# Patient Record
Sex: Female | Born: 1945 | Race: Black or African American | Hispanic: No | Marital: Married | Smoking: Never smoker
Health system: Southern US, Community
[De-identification: ages and names within clinical notes are randomized; demographics above are authoritative.]

## PROBLEM LIST (undated history)

## (undated) DIAGNOSIS — I10 Essential (primary) hypertension: Secondary | ICD-10-CM

## (undated) DIAGNOSIS — D649 Anemia, unspecified: Secondary | ICD-10-CM

## (undated) DIAGNOSIS — E785 Hyperlipidemia, unspecified: Secondary | ICD-10-CM

## (undated) DIAGNOSIS — H9190 Unspecified hearing loss, unspecified ear: Secondary | ICD-10-CM

## (undated) DIAGNOSIS — M1990 Unspecified osteoarthritis, unspecified site: Secondary | ICD-10-CM

## (undated) DIAGNOSIS — H269 Unspecified cataract: Secondary | ICD-10-CM

## (undated) DIAGNOSIS — G4733 Obstructive sleep apnea (adult) (pediatric): Secondary | ICD-10-CM

## (undated) HISTORY — DX: Hyperlipidemia, unspecified: E78.5

## (undated) HISTORY — PX: ABDOMINAL HYSTERECTOMY: SHX81

## (undated) HISTORY — DX: Essential (primary) hypertension: I10

## (undated) HISTORY — DX: Unspecified cataract: H26.9

## (undated) HISTORY — DX: Unspecified osteoarthritis, unspecified site: M19.90

## (undated) HISTORY — PX: COLONOSCOPY: SHX174

## (undated) HISTORY — DX: Anemia, unspecified: D64.9

## (undated) HISTORY — DX: Unspecified hearing loss, unspecified ear: H91.90

## (undated) HISTORY — PX: POLYPECTOMY: SHX149

---
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
## 1984-07-01 HISTORY — PX: MANDIBLE SURGERY: SHX707

## 1997-11-18 ENCOUNTER — Other Ambulatory Visit: Admission: RE | Admit: 1997-11-18 | Discharge: 1997-11-18 | Payer: Self-pay | Admitting: Family Medicine

## 1998-06-22 ENCOUNTER — Encounter: Payer: Self-pay | Admitting: Family Medicine

## 1998-06-22 ENCOUNTER — Ambulatory Visit (HOSPITAL_COMMUNITY): Admission: RE | Admit: 1998-06-22 | Discharge: 1998-06-22 | Payer: Self-pay | Admitting: Family Medicine

## 1999-06-27 ENCOUNTER — Ambulatory Visit (HOSPITAL_COMMUNITY): Admission: RE | Admit: 1999-06-27 | Discharge: 1999-06-27 | Payer: Self-pay | Admitting: Family Medicine

## 1999-06-27 ENCOUNTER — Encounter: Payer: Self-pay | Admitting: Family Medicine

## 1999-12-19 ENCOUNTER — Encounter: Payer: Self-pay | Admitting: Family Medicine

## 1999-12-19 ENCOUNTER — Ambulatory Visit (HOSPITAL_COMMUNITY): Admission: RE | Admit: 1999-12-19 | Discharge: 1999-12-19 | Payer: Self-pay | Admitting: Family Medicine

## 2000-07-04 ENCOUNTER — Encounter: Payer: Self-pay | Admitting: Family Medicine

## 2000-07-04 ENCOUNTER — Ambulatory Visit (HOSPITAL_COMMUNITY): Admission: RE | Admit: 2000-07-04 | Discharge: 2000-07-04 | Payer: Self-pay | Admitting: Family Medicine

## 2000-12-02 ENCOUNTER — Encounter: Payer: Self-pay | Admitting: Family Medicine

## 2000-12-02 ENCOUNTER — Encounter: Admission: RE | Admit: 2000-12-02 | Discharge: 2000-12-02 | Payer: Self-pay | Admitting: Family Medicine

## 2001-07-30 ENCOUNTER — Other Ambulatory Visit: Admission: RE | Admit: 2001-07-30 | Discharge: 2001-07-30 | Payer: Self-pay | Admitting: Obstetrics and Gynecology

## 2002-05-21 ENCOUNTER — Encounter: Admission: RE | Admit: 2002-05-21 | Discharge: 2002-05-21 | Payer: Self-pay | Admitting: Family Medicine

## 2002-05-21 ENCOUNTER — Encounter: Payer: Self-pay | Admitting: Family Medicine

## 2002-07-20 ENCOUNTER — Other Ambulatory Visit: Admission: RE | Admit: 2002-07-20 | Discharge: 2002-07-20 | Payer: Self-pay | Admitting: Internal Medicine

## 2003-05-27 ENCOUNTER — Ambulatory Visit (HOSPITAL_COMMUNITY): Admission: RE | Admit: 2003-05-27 | Discharge: 2003-05-27 | Payer: Self-pay | Admitting: Family Medicine

## 2003-09-21 ENCOUNTER — Emergency Department (HOSPITAL_COMMUNITY): Admission: AD | Admit: 2003-09-21 | Discharge: 2003-09-21 | Payer: Self-pay | Admitting: Family Medicine

## 2004-06-28 ENCOUNTER — Ambulatory Visit (HOSPITAL_COMMUNITY): Admission: RE | Admit: 2004-06-28 | Discharge: 2004-06-28 | Payer: Self-pay | Admitting: Internal Medicine

## 2004-09-30 ENCOUNTER — Emergency Department (HOSPITAL_COMMUNITY): Admission: EM | Admit: 2004-09-30 | Discharge: 2004-09-30 | Payer: Self-pay | Admitting: Family Medicine

## 2005-07-02 ENCOUNTER — Ambulatory Visit (HOSPITAL_COMMUNITY): Admission: RE | Admit: 2005-07-02 | Discharge: 2005-07-02 | Payer: Self-pay | Admitting: Internal Medicine

## 2006-07-10 ENCOUNTER — Ambulatory Visit (HOSPITAL_COMMUNITY): Admission: RE | Admit: 2006-07-10 | Discharge: 2006-07-10 | Payer: Self-pay | Admitting: Obstetrics and Gynecology

## 2007-09-30 ENCOUNTER — Ambulatory Visit: Payer: Self-pay | Admitting: Gastroenterology

## 2007-10-12 ENCOUNTER — Ambulatory Visit (HOSPITAL_COMMUNITY): Admission: RE | Admit: 2007-10-12 | Discharge: 2007-10-12 | Payer: Self-pay | Admitting: Obstetrics & Gynecology

## 2007-10-15 ENCOUNTER — Ambulatory Visit: Payer: Self-pay | Admitting: Gastroenterology

## 2007-10-15 ENCOUNTER — Encounter: Payer: Self-pay | Admitting: Gastroenterology

## 2008-10-12 ENCOUNTER — Encounter: Admission: RE | Admit: 2008-10-12 | Discharge: 2008-10-12 | Payer: Self-pay | Admitting: Family Medicine

## 2008-11-01 ENCOUNTER — Encounter: Admission: RE | Admit: 2008-11-01 | Discharge: 2008-11-01 | Payer: Self-pay | Admitting: Family Medicine

## 2009-09-19 ENCOUNTER — Encounter (INDEPENDENT_AMBULATORY_CARE_PROVIDER_SITE_OTHER): Payer: Self-pay | Admitting: *Deleted

## 2009-10-18 ENCOUNTER — Encounter: Admission: RE | Admit: 2009-10-18 | Discharge: 2009-10-18 | Payer: Self-pay | Admitting: Family Medicine

## 2010-04-29 ENCOUNTER — Emergency Department (HOSPITAL_COMMUNITY): Admission: EM | Admit: 2010-04-29 | Discharge: 2010-04-29 | Payer: Self-pay | Admitting: Family Medicine

## 2010-07-31 NOTE — Letter (Signed)
Summary: Colonoscopy Letter  [facility]  [address] [place], [place] [address]   Phone: [phone]  Fax: [phone]      September 19, 2009 MRN: [id number]   Deanna Haley [address] [place], [place]  [address]   Dear Deanna Haley,   According to your medical record, it is time for you to schedule a Colonoscopy. The American Cancer Society recommends this procedure as a method to detect early colon cancer. Patients with a family history of colon cancer, or a personal history of colon polyps or inflammatory bowel disease are at increased risk.  This letter has been generated based on the recommendations made at the time of your procedure. If you feel that in your particular situation this may no longer apply, please contact our office.  Please call our office at [phone] to schedule this appointment or to update your records at your earliest convenience.  Thank you for cooperating with [place] to provide you with the very best care possible.   Sincerely,  Barbette Hair. Arlyce Dice, M.D.  [facility] [phone]

## 2010-08-29 HISTORY — PX: NM MYOCAR PERF WALL MOTION: HXRAD629

## 2010-08-30 HISTORY — PX: DOPPLER ECHOCARDIOGRAPHY: SHX263

## 2010-12-11 ENCOUNTER — Other Ambulatory Visit: Payer: Self-pay | Admitting: Family Medicine

## 2010-12-11 DIAGNOSIS — Z1231 Encounter for screening mammogram for malignant neoplasm of breast: Secondary | ICD-10-CM

## 2010-12-17 ENCOUNTER — Other Ambulatory Visit: Payer: Self-pay | Admitting: Family Medicine

## 2010-12-17 DIAGNOSIS — M79604 Pain in right leg: Secondary | ICD-10-CM

## 2010-12-19 ENCOUNTER — Ambulatory Visit
Admission: RE | Admit: 2010-12-19 | Discharge: 2010-12-19 | Disposition: A | Discharge status: Home / Self Care | Payer: BC Managed Care – PPO | Source: Ambulatory Visit | Attending: Family Medicine | Admitting: Family Medicine

## 2010-12-19 DIAGNOSIS — Z1231 Encounter for screening mammogram for malignant neoplasm of breast: Secondary | ICD-10-CM

## 2010-12-26 ENCOUNTER — Ambulatory Visit
Admission: RE | Admit: 2010-12-26 | Discharge: 2010-12-26 | Disposition: A | Discharge status: Home / Self Care | Payer: BC Managed Care – PPO | Source: Ambulatory Visit | Attending: Family Medicine | Admitting: Family Medicine

## 2010-12-26 DIAGNOSIS — M79604 Pain in right leg: Secondary | ICD-10-CM

## 2011-01-03 ENCOUNTER — Ambulatory Visit (AMBULATORY_SURGERY_CENTER): Payer: BC Managed Care – PPO | Admitting: *Deleted

## 2011-01-03 VITALS — Ht 60.0 in | Wt 168.8 lb

## 2011-01-03 DIAGNOSIS — Z86010 Personal history of colonic polyps: Secondary | ICD-10-CM

## 2011-01-03 MED ORDER — MOVIPREP 100 G PO SOLR: ORAL | Status: DC

## 2011-01-14 ENCOUNTER — Ambulatory Visit (AMBULATORY_SURGERY_CENTER): Payer: BC Managed Care – PPO | Admitting: Gastroenterology

## 2011-01-14 ENCOUNTER — Encounter: Payer: Self-pay | Admitting: Gastroenterology

## 2011-01-14 VITALS — BP 115/70 | HR 70 | Temp 98.0°F | Resp 18

## 2011-01-14 DIAGNOSIS — Z1211 Encounter for screening for malignant neoplasm of colon: Secondary | ICD-10-CM

## 2011-01-14 DIAGNOSIS — Z860100 Personal history of colon polyps, unspecified: Secondary | ICD-10-CM

## 2011-01-14 MED ORDER — SODIUM CHLORIDE 0.9 % IV SOLN: 500.0000 mL | INTRAVENOUS | Status: DC

## 2011-01-14 NOTE — Patient Instructions (Signed)
Discharge instructions given with verbal understanding. Normal examination. Resume previous medications. 

## 2011-01-15 ENCOUNTER — Telehealth: Payer: Self-pay

## 2011-01-15 NOTE — Telephone Encounter (Signed)

## 2011-08-10 ENCOUNTER — Other Ambulatory Visit: Payer: Self-pay | Admitting: Physician Assistant

## 2011-08-22 ENCOUNTER — Other Ambulatory Visit: Payer: Self-pay | Admitting: Physician Assistant

## 2011-12-06 ENCOUNTER — Other Ambulatory Visit: Payer: Self-pay | Admitting: Physician Assistant

## 2011-12-06 ENCOUNTER — Other Ambulatory Visit: Payer: Self-pay | Admitting: Family Medicine

## 2011-12-24 ENCOUNTER — Other Ambulatory Visit: Payer: Self-pay | Admitting: Family Medicine

## 2011-12-24 DIAGNOSIS — Z1231 Encounter for screening mammogram for malignant neoplasm of breast: Secondary | ICD-10-CM

## 2012-01-01 ENCOUNTER — Ambulatory Visit
Admission: RE | Admit: 2012-01-01 | Discharge: 2012-01-01 | Disposition: A | Discharge status: Home / Self Care | Payer: BC Managed Care – PPO | Source: Ambulatory Visit | Attending: Family Medicine | Admitting: Family Medicine

## 2012-01-01 DIAGNOSIS — Z1231 Encounter for screening mammogram for malignant neoplasm of breast: Secondary | ICD-10-CM

## 2012-01-20 ENCOUNTER — Other Ambulatory Visit: Payer: Self-pay | Admitting: Physician Assistant

## 2012-01-21 NOTE — Telephone Encounter (Signed)
Need chart to nurses station. 

## 2012-02-27 ENCOUNTER — Other Ambulatory Visit: Payer: Self-pay | Admitting: Family Medicine

## 2012-02-27 ENCOUNTER — Ambulatory Visit: Payer: Medicare Other | Admitting: Family Medicine

## 2012-02-27 MED ORDER — ATORVASTATIN CALCIUM 10 MG PO TABS: 10.0000 mg | ORAL_TABLET | Freq: Every day | ORAL | Status: DC

## 2012-02-27 MED ORDER — LOSARTAN POTASSIUM-HCTZ 50-12.5 MG PO TABS: 1.0000 | ORAL_TABLET | Freq: Every day | ORAL | Status: DC

## 2012-04-15 ENCOUNTER — Encounter: Payer: Self-pay | Admitting: Family Medicine

## 2012-04-15 ENCOUNTER — Ambulatory Visit (INDEPENDENT_AMBULATORY_CARE_PROVIDER_SITE_OTHER): Payer: Medicare Other | Admitting: Family Medicine

## 2012-04-15 VITALS — BP 147/87 | HR 68 | Temp 97.8°F | Resp 16 | Ht 62.0 in | Wt 178.0 lb

## 2012-04-15 DIAGNOSIS — K3189 Other diseases of stomach and duodenum: Secondary | ICD-10-CM

## 2012-04-15 DIAGNOSIS — E785 Hyperlipidemia, unspecified: Secondary | ICD-10-CM

## 2012-04-15 DIAGNOSIS — R300 Dysuria: Secondary | ICD-10-CM

## 2012-04-15 DIAGNOSIS — R1013 Epigastric pain: Secondary | ICD-10-CM

## 2012-04-15 DIAGNOSIS — H9190 Unspecified hearing loss, unspecified ear: Secondary | ICD-10-CM | POA: Insufficient documentation

## 2012-04-15 DIAGNOSIS — I10 Essential (primary) hypertension: Secondary | ICD-10-CM

## 2012-04-15 DIAGNOSIS — Z23 Encounter for immunization: Secondary | ICD-10-CM

## 2012-04-15 DIAGNOSIS — N390 Urinary tract infection, site not specified: Secondary | ICD-10-CM

## 2012-04-15 HISTORY — DX: Hyperlipidemia, unspecified: E78.5

## 2012-04-15 HISTORY — DX: Essential (primary) hypertension: I10

## 2012-04-15 LAB — POCT URINALYSIS DIPSTICK
Ketones, UA: NEGATIVE
Protein, UA: NEGATIVE
Spec Grav, UA: <=1.005
Urobilinogen, UA: 0.2
pH, UA: 6

## 2012-04-15 LAB — POCT UA - MICROSCOPIC ONLY
Casts, Ur, LPF, POC: NEGATIVE
Mucus, UA: NEGATIVE
Yeast, UA: NEGATIVE

## 2012-04-15 MED ORDER — LOSARTAN POTASSIUM-HCTZ 50-12.5 MG PO TABS: 1.0000 | ORAL_TABLET | Freq: Every day | ORAL | Status: DC

## 2012-04-15 MED ORDER — CIPROFLOXACIN HCL 250 MG PO TABS: 250.0000 mg | ORAL_TABLET | Freq: Two times a day (BID) | ORAL | Status: DC

## 2012-04-15 MED ORDER — GABAPENTIN 300 MG PO CAPS: ORAL_CAPSULE | ORAL | Status: DC

## 2012-04-15 MED ORDER — ATORVASTATIN CALCIUM 10 MG PO TABS: 10.0000 mg | ORAL_TABLET | Freq: Every day | ORAL | Status: DC

## 2012-04-15 NOTE — Patient Instructions (Addendum)
Indigestion  Indigestion is discomfort in the upper belly (abdomen). HOME CARE  Avoid foods and drinks that make your problems worse. You may want to avoid:  Caffeine and alcohol.  Chocolate.  Peppermint.  Garlic and onions.  Spicy foods.  Citrus fruits, such as oranges, lemons, or limes.  Tomato-based foods such as sauce, chili, salsa, and pizza.  Fried and fatty foods.  Avoid eating for 3 hours before your bedtime.  Eat small meals instead of large meals more often.  Stop smoking if you smoke.  Maintain a healthy weight.  Wear loose-fitting clothing. Do not wear anything tight around your waist.  Raise the head of your bed 4 to 8 inches with wood blocks.  Only take medicines as told by your doctor.  Do not take aspirin or ibuprofen. GET HELP RIGHT AWAY IF:  You are not better after 2 days.  You have chest pain that goes into your neck, arms, back, jaw, or upper belly.  You have trouble swallowing.  You keep throwing up (vomiting).  You have black or bloody poop (stool).  You have a fever.  You have trouble breathing, you feel dizzy, or you pass out (faint).  You are sweating a lot.  You have severe belly pain.  You lose weight without trying. MAKE SURE YOU:  Understand these instructions.  Will watch your condition.  Will get help right away if you are not doing well or get worse. Document Released: 07/20/2010 Document Revised: 09/09/2011 Document Reviewed: 01/30/2011 [facility] Patient Information 2013 [place], [place].   Medication change: Gabapentin 300 mg for pain- take 1 capsule at bedtime until November 1st; then increase dose to 2 capsules at bedtime. This medication may make you sleepy.

## 2012-04-15 NOTE — Progress Notes (Signed)
Subjective:    Patient ID: Deanna Haley, female    DOB: 1945/10/25, 66 y.o.   MRN: [id number]  HPI  This 66 y.o. AA female, who is deaf, is accompanied by 2 sign language interpreters (one is an  Tax inspector). She c/o 1st episode of heartburn this AM; she ate fried chicken for dinner last PM ~ 5:30 PM.  She has had nothing to eat this AM and usually takes her medications every AM. She is not taking  Gabapentin because it is not effective for lower limb pain.   She also c/o dysuria and strong odor. This past weekend, she attended a reunion and had  significant alcohol intake and reduced water consumption.    She is requesting a Flu vaccine. She received Pneumovax in 2012. Pt is concerned that she  received this vaccine prior to 66th birthday.      Review of Systems  Constitutional: Negative.   HENT: Negative for sore throat and trouble swallowing.   Respiratory: Negative for cough, choking and chest tightness.   Cardiovascular: Negative.   Gastrointestinal: Negative.   Genitourinary: Positive for dysuria. Negative for urgency, frequency, hematuria, flank pain, difficulty urinating and pelvic pain.  Neurological: Negative.        Objective:   Physical Exam  Nursing note and vitals reviewed. Constitutional: She is oriented to person, place, and time. She appears well-developed and well-nourished. No distress.  HENT:  Head: Normocephalic and atraumatic.  Right Ear: External ear normal.  Left Ear: External ear normal.  Nose: Nose normal.  Eyes: Conjunctivae normal and EOM are normal. Pupils are equal, round, and reactive to light. No scleral icterus.  Cardiovascular: Normal rate, regular rhythm and normal heart sounds.  Exam reveals no gallop and no friction rub.   No murmur heard. Pulmonary/Chest: Effort normal and breath sounds normal. No respiratory distress.  Abdominal: Soft. Bowel sounds are normal. She exhibits no distension and no mass. There is no tenderness. There is  no guarding.  Neurological: She is alert and oriented to person, place, and time. No cranial nerve deficit. Coordination normal.  Skin: Skin is warm and dry.  Psychiatric: She has a normal mood and affect. Her behavior is normal. Judgment and thought content normal.    Results for orders placed in visit on 04/15/12  POCT URINALYSIS DIPSTICK      Component Value Range   Color, UA yellow     Clarity, UA hazy     Glucose, UA neg     Bilirubin, UA neg     Ketones, UA neg     Spec Grav, UA <=1.005     Blood, UA mod     pH, UA 6.0     Protein, UA neg     Urobilinogen, UA 0.2     Nitrite, UA neg     Leukocytes, UA large (3+)    POCT UA - MICROSCOPIC ONLY      Component Value Range   WBC, Ur, HPF, POC TNTC     RBC, urine, microscopic 10-20     Bacteria, U Microscopic 2+ bacilli     Mucus, UA neg     Epithelial cells, urine per micros 0-2     Crystals, Ur, HPF, POC neg     Casts, Ur, LPF, POC neg     Yeast, UA neg           Assessment & Plan:   1. Urinary tract infection, site not specified  Urine culture RX: Cipro 250  mg 1 tab bid pending culture  2. Dysuria  POCT urinalysis dipstick, POCT UA - Microscopic Only  3. Dyspepsia  Avoid offending foods Have small snack at bedtime if dinner is before 6 PM  4. HTN (hypertension)  Comprehensive metabolic panel RF: Losartan-HCTZ  50-12.5 mg  1 tablet daily  5. Need for prophylactic vaccination and inoculation against influenza  Flu vaccine greater than or equal to 3yo preservative free IM  6. Hyperlipidemia  Lipid panel Continue Atorvastatin 10 mg  1 tablet daily

## 2012-04-16 LAB — LIPID PANEL
HDL: 66 mg/dL (ref >39)
Total CHOL/HDL Ratio: 2.8 Ratio
VLDL: 14 mg/dL (ref 0–40)

## 2012-04-16 LAB — COMPREHENSIVE METABOLIC PANEL
ALT: 15 U/L (ref 0–35)
AST: 19 U/L (ref 0–37)
BUN: 18 mg/dL (ref 6–23)
Creat: 0.9 mg/dL (ref 0.50–1.10)
Total Bilirubin: 0.5 mg/dL (ref 0.3–1.2)

## 2012-04-17 ENCOUNTER — Other Ambulatory Visit: Payer: Self-pay | Admitting: Family Medicine

## 2012-04-17 LAB — URINE CULTURE

## 2012-04-17 MED ORDER — CIPROFLOXACIN HCL 250 MG PO TABS: 250.0000 mg | ORAL_TABLET | Freq: Two times a day (BID) | ORAL | Status: DC

## 2012-04-17 NOTE — Progress Notes (Signed)
Quick Note:  Pt is deaf; please advise that the following labs are abnormal... You do have a bladder infection and you are taking an antibiotic that should clear up the infection. I prescribed enough medication for 5 days but you will need to take it for 5 more days. I have e-prescribed another 10 tablets for you to pick-up from your pharmacy. If you are still having symptoms (burning with urination) after finishing the antibiotic, contact the office for advise.  Your other labs/ blood tests look great.  Copy to pt. ______

## 2012-06-17 ENCOUNTER — Ambulatory Visit: Payer: Medicare Other | Admitting: Family Medicine

## 2012-06-26 ENCOUNTER — Encounter: Payer: Self-pay | Admitting: Family Medicine

## 2012-06-26 ENCOUNTER — Ambulatory Visit (INDEPENDENT_AMBULATORY_CARE_PROVIDER_SITE_OTHER): Payer: Medicare Other | Admitting: Family Medicine

## 2012-06-26 VITALS — BP 130/82 | HR 94 | Temp 98.0°F | Resp 16 | Ht 60.0 in | Wt 174.0 lb

## 2012-06-26 DIAGNOSIS — I10 Essential (primary) hypertension: Secondary | ICD-10-CM

## 2012-06-26 DIAGNOSIS — Z760 Encounter for issue of repeat prescription: Secondary | ICD-10-CM

## 2012-06-26 LAB — LIPID PANEL
Cholesterol: 200 mg/dL (ref 0–200)
VLDL: 20 mg/dL (ref 0–40)

## 2012-06-26 LAB — COMPREHENSIVE METABOLIC PANEL
ALT: 18 U/L (ref 0–35)
AST: 19 U/L (ref 0–37)
Albumin: 4.1 g/dL (ref 3.5–5.2)
BUN: 20 mg/dL (ref 6–23)
CO2: 28 mEq/L (ref 19–32)
Calcium: 10.5 mg/dL (ref 8.4–10.5)
Chloride: 105 mEq/L (ref 96–112)
Potassium: 4.2 mEq/L (ref 3.5–5.3)

## 2012-06-26 MED ORDER — LOSARTAN POTASSIUM-HCTZ 50-12.5 MG PO TABS: 1.0000 | ORAL_TABLET | Freq: Every day | ORAL | Status: DC

## 2012-06-26 MED ORDER — ATORVASTATIN CALCIUM 10 MG PO TABS: 10.0000 mg | ORAL_TABLET | Freq: Every day | ORAL | Status: DC

## 2012-06-30 NOTE — Progress Notes (Signed)
S;  This 66 y.o. AA female is here today with sign language interpreter; she feels well and reports that Gabapentin for leg pain has been very effective. She can ambulate ,stand and sleep w/o nagging leg discomfort. She has no medication side effects and no new symptoms.  ROS: Noncontributory.  O:  Filed Vitals:   06/26/12 1600  BP: 130/82  Pulse: 94  Temp: 98 F (36.7 C)  Resp: 16   GEN: In NAD; WN,WD. HENT: [place]/AT; EOMI w/ clear conj/sclerae. EACs normal. Oral mucosa moist and clear. NECK: Supple w/o LAN or TMG. COR: RRR. No edema. LUNGS: Normal resp rate and effort. NEURO: A&O x 3; nonfocal.  A?P: 1. HTN (hypertension) - stable. Continue current treatment. TSH, Lipid panel, Comprehensive metabolic panel  2. Issue of repeat prescriptions

## 2012-07-01 NOTE — Progress Notes (Signed)
Quick Note:  Please notify pt that results are normal.   Provide pt with copy of labs. ______ 

## 2012-09-24 ENCOUNTER — Encounter: Payer: Self-pay | Admitting: Family Medicine

## 2012-09-24 ENCOUNTER — Ambulatory Visit (INDEPENDENT_AMBULATORY_CARE_PROVIDER_SITE_OTHER): Payer: Medicare Other | Admitting: Family Medicine

## 2012-09-24 VITALS — BP 122/84 | HR 71 | Temp 98.7°F | Resp 16 | Ht 60.0 in | Wt 178.2 lb

## 2012-09-24 DIAGNOSIS — Z23 Encounter for immunization: Secondary | ICD-10-CM

## 2012-09-24 DIAGNOSIS — Z0000 Encounter for general adult medical examination without abnormal findings: Secondary | ICD-10-CM

## 2012-09-24 LAB — POCT URINALYSIS DIPSTICK
Blood, UA: NEGATIVE
Protein, UA: NEGATIVE
Spec Grav, UA: 1.01
Urobilinogen, UA: 0.2

## 2012-09-24 MED ORDER — GABAPENTIN 300 MG PO CAPS: ORAL_CAPSULE | ORAL | Status: DC

## 2012-09-24 NOTE — Progress Notes (Signed)
Subjective:    Patient ID: Deanna Haley, female    DOB: 1945-11-29, 67 y.o.   MRN: [id number]  HPI  This 67 y.o. AA female is here for CPE. She is daf and is accompanied by a sign interpreter as  well as a Consulting civil engineer of sign language. Pt reports no new problems and feels well. Pt requested a PAP  but had TAH in 1994 for benign reason. She does monthly BSE and MMG is UTD.    Last Colonoscopy: 2012 (per pt- normal).   Review of Systems  Constitutional: Negative.   HENT: Positive for hearing loss.   Eyes: Negative.   Respiratory: Negative.   Cardiovascular: Negative.   Gastrointestinal: Negative.   Endocrine: Negative.   Genitourinary: Negative.   Musculoskeletal: Negative.   Skin: Negative.   Allergic/Immunologic: Negative.   Neurological: Negative.   Psychiatric/Behavioral: Negative.        Objective:   Physical Exam  Nursing note and vitals reviewed. Constitutional: She is oriented to person, place, and time. Vital signs are normal. She appears well-developed and well-nourished. No distress.  HENT:  Head: Normocephalic and atraumatic.  Right Ear: Tympanic membrane, external ear and ear canal normal. Decreased hearing is noted.  Left Ear: Tympanic membrane, external ear and ear canal normal. Decreased hearing is noted.  Nose: Nose normal. No mucosal edema, nasal deformity or septal deviation.  Mouth/Throat: Uvula is midline, oropharynx is clear and moist and mucous membranes are normal. No oral lesions. Normal dentition. No dental caries. No posterior oropharyngeal erythema.  Eyes: Conjunctivae, EOM and lids are normal. Pupils are equal, round, and reactive to light. No scleral icterus.  Fundoscopic exam:      The right eye shows no papilledema. The right eye shows red reflex.       The left eye shows no papilledema. The left eye shows red reflex.  Pt has exam done by eye care specialist  Neck: Normal range of motion. Neck supple. No thyromegaly present.   Cardiovascular: Normal rate, regular rhythm, normal heart sounds and intact distal pulses.  Exam reveals no gallop and no friction rub.   No murmur heard. Pulmonary/Chest: Effort normal and breath sounds normal. No respiratory distress. Right breast exhibits no inverted nipple, no mass, no nipple discharge, no skin change and no tenderness. Left breast exhibits no inverted nipple, no mass, no nipple discharge, no skin change and no tenderness. Breasts are symmetrical.  Abdominal: Soft. Normal appearance, normal aorta and bowel sounds are normal. She exhibits no distension, no pulsatile midline mass and no mass. There is no hepatosplenomegaly. There is no tenderness. There is no guarding and no CVA tenderness. No hernia.  Genitourinary: Rectum normal and vagina normal. Rectal exam shows no external hemorrhoid, no fissure, no mass, no tenderness and anal tone normal. Guaiac negative stool. There is no rash, tenderness or lesion on the right labia. There is no rash, tenderness or lesion on the left labia. Right adnexum displays no mass and no tenderness. Left adnexum displays no mass and no tenderness.  Musculoskeletal: Normal range of motion. She exhibits no edema and no tenderness.  Lymphadenopathy:    She has no cervical adenopathy.       Right: No inguinal adenopathy present.       Left: No inguinal adenopathy present.  Neurological: She is alert and oriented to person, place, and time. She has normal reflexes. She exhibits normal muscle tone. Coordination normal.  Pt is deaf; otherwise CNs intact.  Skin: Skin is warm  and dry. No rash noted. No erythema.  Psychiatric: She has a normal mood and affect. Her behavior is normal. Judgment and thought content normal.    Results for orders placed in visit on 09/24/12  IFOBT (OCCULT BLOOD)      Result Value Range   IFOBT Negative    POCT URINALYSIS DIPSTICK      Result Value Range   Color, UA yellow     Clarity, UA clear     Glucose, UA neg      Bilirubin, UA neg     Ketones, UA neg     Spec Grav, UA 1.010     Blood, UA neg     pH, UA 5.5     Protein, UA neg     Urobilinogen, UA 0.2     Nitrite, UA neg     Leukocytes, UA Negative         Assessment & Plan:  Routine general medical examination at a health care facility - Plan: IFOBT POC (occult bld, rslt in office), POCT urinalysis dipstick  Need for prophylactic vaccination with combined diphtheria-tetanus-pertussis (DTP) vaccine - Plan: Tdap vaccine greater than or equal to 7yo IM  Continue current medications; no labs needed at this time (labs done in Dec 2013 were all normal).

## 2012-09-24 NOTE — Patient Instructions (Addendum)
Best over-the-counter Vitamin D supplement- Nature Made 1000 IU  Take 1 capsule daily.  Take your cholesterol medication (Atorvastatin) at bedtime or after your last meal of the day.   Keeping You Healthy  Get These Tests  Blood Pressure- Have your blood pressure checked by your healthcare provider at least once a year.  Normal blood pressure is 120/80.  Weight- Have your body mass index (BMI) calculated to screen for obesity.  BMI is a measure of body fat based on height and weight.  You can calculate your own BMI at https://www.west-esparza.com/  Cholesterol- Have your cholesterol checked every year.  Diabetes- Have your blood sugar checked every year if you have high blood pressure, high cholesterol, a family history of diabetes or if you are overweight.  Pap Smear- Have a pap smear every 1 to 3 years if you have been sexually active.  If you are older than 65 and recent pap smears have been normal you may not need additional pap smears.  In addition, if you have had a hysterectomy  For benign disease additional pap smears are not necessary.  Since you have had a hysterectomy, you do not need to have a PAP test.  Mammogram-Yearly mammograms are essential for early detection of breast cancer  Screening for Colon Cancer- Colonoscopy starting at age 58. Screening may begin sooner depending on your family history and other health conditions.  Follow up colonoscopy as directed by your Gastroenterologist.  Screening for Osteoporosis- Screening begins at age 72 with bone density scanning, sooner if you are at higher risk for developing Osteoporosis. This is a test that we can discuss in the future.  Get these medicines  Calcium with Vitamin D- Your body requires 1200-1500 mg of Calcium a day and [phone] IU of Vitamin D a day.  You can only absorb 500 mg of Calcium at a time therefore Calcium must be taken in 2 or 3 separate doses throughout the day.  Hormones- Hormone therapy has been associated  with increased risk for certain cancers and heart disease.  Talk to your healthcare provider about if you need relief from menopausal symptoms.  Aspirin- Ask your healthcare provider about taking Aspirin to prevent Heart Disease and Stroke.  Get these Immuniztions  Flu shot- Every fall  Pneumonia shot- Once after the age of 35; if you are younger ask your healthcare provider if you need a pneumonia shot.  Tetanus- Every ten years. You received a Tdap today; next Tetanus due in 10 years.  Zostavax- Once after the age of 69 to prevent shingles. You are up-to-date with this vaccine.  Take these steps  Don't smoke- Your healthcare provider can help you quit. For tips on how to quit, ask your healthcare provider or go to www.smokefree.gov or call 1-800 QUIT-NOW.  Be physically active- Exercise 5 days a week for a minimum of 30 minutes.  If you are not already physically active, start slow and gradually work up to 30 minutes of moderate physical activity.  Try walking, dancing, bike riding, swimming, etc.  Eat a healthy diet- Eat a variety of healthy foods such as fruits, vegetables, whole grains, low fat milk, low fat cheeses, yogurt, lean meats, chicken, fish, eggs, dried beans, tofu, etc.  For more information go to www.thenutritionsource.org  Dental visit- Brush and floss teeth twice daily; visit your dentist twice a year.  Eye exam- Visit your Optometrist or Ophthalmologist yearly.  Drink alcohol in moderation- Limit alcohol intake to one drink or less a  day.  Never drink and drive.  Depression- Your emotional health is as important as your physical health.  If you're feeling down or losing interest in things you normally enjoy, please talk to your healthcare provider.  Seat Belts- can save your life; always wear one  Smoke/Carbon Monoxide detectors- These detectors need to be installed on the appropriate level of your home.  Replace batteries at least once a year.  Violence- If  anyone is threatening or hurting you, please tell your healthcare provider.  Living Will/ Health care power of attorney- Discuss with your healthcare provider and family.

## 2012-10-25 ENCOUNTER — Other Ambulatory Visit: Payer: Self-pay | Admitting: Family Medicine

## 2012-11-01 ENCOUNTER — Other Ambulatory Visit: Payer: Self-pay | Admitting: Family Medicine

## 2012-12-30 ENCOUNTER — Other Ambulatory Visit: Payer: Self-pay | Admitting: Family Medicine

## 2013-01-29 ENCOUNTER — Other Ambulatory Visit: Payer: Self-pay

## 2013-01-29 DIAGNOSIS — Z1231 Encounter for screening mammogram for malignant neoplasm of breast: Secondary | ICD-10-CM

## 2013-02-18 ENCOUNTER — Ambulatory Visit
Admission: RE | Admit: 2013-02-18 | Discharge: 2013-02-18 | Disposition: A | Discharge status: Home / Self Care | Payer: Medicare Other | Source: Ambulatory Visit

## 2013-02-18 DIAGNOSIS — Z1231 Encounter for screening mammogram for malignant neoplasm of breast: Secondary | ICD-10-CM

## 2013-03-25 ENCOUNTER — Encounter: Payer: Self-pay | Admitting: Family Medicine

## 2013-03-25 ENCOUNTER — Ambulatory Visit (INDEPENDENT_AMBULATORY_CARE_PROVIDER_SITE_OTHER): Payer: Medicare Other | Admitting: Family Medicine

## 2013-03-25 VITALS — BP 140/72 | HR 77 | Temp 98.1°F | Resp 16 | Ht 60.0 in | Wt 176.0 lb

## 2013-03-25 DIAGNOSIS — IMO0002 Reserved for concepts with insufficient information to code with codable children: Secondary | ICD-10-CM

## 2013-03-25 DIAGNOSIS — S86899A Other injury of other muscle(s) and tendon(s) at lower leg level, unspecified leg, initial encounter: Secondary | ICD-10-CM

## 2013-03-25 DIAGNOSIS — Z23 Encounter for immunization: Secondary | ICD-10-CM

## 2013-03-25 DIAGNOSIS — E785 Hyperlipidemia, unspecified: Secondary | ICD-10-CM

## 2013-03-25 DIAGNOSIS — I10 Essential (primary) hypertension: Secondary | ICD-10-CM

## 2013-03-25 LAB — COMPREHENSIVE METABOLIC PANEL
ALT: 16 U/L (ref 0–35)
AST: 20 U/L (ref 0–37)
Albumin: 4.3 g/dL (ref 3.5–5.2)
CO2: 27 mEq/L (ref 19–32)
Calcium: 10.5 mg/dL (ref 8.4–10.5)
Glucose, Bld: 87 mg/dL (ref 70–99)
Total Protein: 8 g/dL (ref 6.0–8.3)

## 2013-03-25 MED ORDER — ATORVASTATIN CALCIUM 10 MG PO TABS: ORAL_TABLET | ORAL | Status: DC

## 2013-03-25 MED ORDER — LOSARTAN POTASSIUM-HCTZ 50-12.5 MG PO TABS: ORAL_TABLET | ORAL | Status: DC

## 2013-03-25 MED ORDER — GABAPENTIN 300 MG PO CAPS: ORAL_CAPSULE | ORAL | Status: DC

## 2013-03-25 NOTE — Patient Instructions (Addendum)
Shin Splints  Shin splints is a painful condition that is felt in the front, lower parts of the legs. Muscles, cord-like structures that connect muscles to bones (tendons), and the thin layer that covers the shinbone get irritated (inflamed). It can be caused by activities or exercises that are demanding. It may also be caused by sports with sudden starts and stops.  HOME CARE Try the following to help your shin splints heal:  Rest.  Do not exercise as long or as intensly.  Stop the activity that causes the shin pain.  Take medicine as told by your doctor.  Ice, massage, stretch, and strengthen the shin area.  Get shoes with good arch and heel support. The shoe should cushion and support you as you walk or run.  Return to activity slowly or as told by your doctor.  Do not put weight on your shins when you start exercising again. Try cycling or swimming.  Stop running if you have pain.  Warm up before exercising.  Run on a flat and firm surface, if possible.  Change the intensity of your exercise slowly.  Increase your running distance slowly. If you run 5 miles normally, add no more than  mile at a time.  Change your athletic shoes every 6 months, or every 350 to 450 miles. GET HELP RIGHT AWAY IF:  You have very bad pain.  You have trouble walking.  Your problems get worse even with treatment.  Your pain spreads, is worse, or changes over time. MAKE SURE YOU:  Understand these instructions.  Will watch your condition.  Will get help right away if you are not doing well or get worse. Document Released: 02/27/2011 Document Revised: 09/09/2011 Document Reviewed: 02/27/2011 [facility] Patient Information 2014 [place], [place].    You can try moist heat on your legs after walking; dry the skin then apply a topical muscle rub (Salonpas, Flexall, [employer], etc)

## 2013-03-26 NOTE — Progress Notes (Signed)
Quick Note:  Please notify pt that results are normal.   Provide pt with copy of labs. ______ 

## 2013-03-29 MED ORDER — GABAPENTIN 300 MG PO CAPS: ORAL_CAPSULE | ORAL | Status: DC

## 2013-03-29 NOTE — Progress Notes (Signed)
S:  This 67 y.o. AA female is here today with her sign language service provider; she has well controlled HTN and is complaint with all medications w/o adverse effects. BP measurements are occasionally checked at home. She reports taking her medications at recommended times and wants to review this at this visit. Hyperlipidemia- pt takes medication at bedtime and reports no side effects. She also tries to follow a low fat diet.  Pt is exercising regular; she c/o pain in front of legs (along tibia) when she is walking w/ he husband. The pains start after prolonged walking; pt admits that she does not warm up her walking routine. She denies muscle cramps or hip/back pain.   Patient Active Problem List   Diagnosis Date Noted  . HTN (hypertension) 04/15/2012  . Hyperlipidemia 04/15/2012  . Deafness 04/15/2012   PMHx, Soc Hx and Fam Hx reviewed. Medications reconciled.  ROS: As per HPI; negative for diaphoresis, fatigue, abnormal weight change, anorexia, vision disturbances, CP or tightness, palpitations, SOB or DOE, cough, edema, abd pain, rashes or skin lesions, HA, dizziness, weakness, numbness or syncope.  O: Filed Vitals:   03/25/13 1108  BP: 140/72  Pulse: 77  Temp: 98.1 F (36.7 C)  Resp: 16   GEN: in NAD; WN,WD. HENT: [place]/AT; wears corrective lenses. EOMI w/ clear conj/sclerae. EACs/nose/orph unremarkable. COR: RRR. LUNGS: Normal resp rate and effort. SKIN: W&D; intact w/o erythema, ecchymoses or rashes. MS: MAEs; tender anterior shins bilat. No edema or deformities. Neurovasc intact. NEURO: A&Ox 3. Nonfocal.  A/P: HTN (hypertension) - Plan: Comprehensive metabolic panel  Hyperlipidemia - Plan: LDL Cholesterol, Direct  Shin splints, initial encounter- symptomatic treatment discussed w/ pt. Advised to cut back on distance when walking and stretch after 5-minutes warmup walk.  Need for prophylactic vaccination and inoculation against influenza - Plan: Flu Vaccine QUAD 36+ mos  IM   Meds ordered this encounter  Medications  . atorvastatin (LIPITOR) 10 MG tablet    Sig: TAKE 1 TABLET (10 MG TOTAL) BY MOUTH DAILY.    Dispense:  30 tablet    Refill:  11  . gabapentin (NEURONTIN) 300 MG capsule    Sig: Take 2 capsules at bedtime.    Dispense:  60 capsule    Refill:  11  . losartan-hydrochlorothiazide (HYZAAR) 50-12.5 MG per tablet    Sig: TAKE 1 TABLET BY MOUTH DAILY.    Dispense:  30 tablet    Refill:  11

## 2013-04-12 ENCOUNTER — Other Ambulatory Visit: Payer: Self-pay | Admitting: Physician Assistant

## 2013-07-02 ENCOUNTER — Encounter: Payer: Self-pay | Admitting: Family Medicine

## 2013-09-23 ENCOUNTER — Ambulatory Visit: Payer: BC Managed Care – PPO | Admitting: Family Medicine

## 2013-10-08 ENCOUNTER — Ambulatory Visit: Payer: BC Managed Care – PPO | Admitting: Family Medicine

## 2013-10-15 ENCOUNTER — Encounter: Payer: Self-pay | Admitting: Family Medicine

## 2013-10-15 ENCOUNTER — Ambulatory Visit (INDEPENDENT_AMBULATORY_CARE_PROVIDER_SITE_OTHER): Payer: Medicare Other | Admitting: Family Medicine

## 2013-10-15 VITALS — BP 152/80 | HR 70 | Temp 98.3°F | Resp 16 | Ht 60.0 in | Wt 177.4 lb

## 2013-10-15 DIAGNOSIS — I10 Essential (primary) hypertension: Secondary | ICD-10-CM

## 2013-10-15 DIAGNOSIS — R0609 Other forms of dyspnea: Secondary | ICD-10-CM

## 2013-10-15 DIAGNOSIS — R0683 Snoring: Secondary | ICD-10-CM

## 2013-10-15 DIAGNOSIS — R0981 Nasal congestion: Secondary | ICD-10-CM

## 2013-10-15 DIAGNOSIS — J3489 Other specified disorders of nose and nasal sinuses: Secondary | ICD-10-CM

## 2013-10-15 DIAGNOSIS — R51 Headache: Secondary | ICD-10-CM

## 2013-10-15 DIAGNOSIS — R0989 Other specified symptoms and signs involving the circulatory and respiratory systems: Secondary | ICD-10-CM

## 2013-10-15 MED ORDER — BLOOD PRESSURE MONITOR/WRIST DEVI: 1.0000 | Freq: Every day | Status: DC

## 2013-10-15 MED ORDER — DESLORATADINE 5 MG PO TBDP: 5.0000 mg | ORAL_TABLET | Freq: Every day | ORAL | Status: DC

## 2013-10-15 NOTE — Patient Instructions (Addendum)
Sinus Headache A sinus headache happens when your sinuses become clogged or puffy (swollen). Sinus headaches can be mild or severe. HOME CARE  Take your medicines (antibiotics) as told. Finish them even if you start to feel better.  Only take medicine as told by your doctor.  Use a nose spray if you feel stuffed up (congested). GET HELP RIGHT AWAY IF:  You have a fever.  You have trouble seeing.  You suddenly have pain in your face or head.  You start to twitch or shake (seizure).  You are confused.  You get headaches more than once a week.  Light or sound bothers you.  You feel sick to your stomach (nauseous) or throw up (vomit).  Your headaches do not get better with treatment. MAKE SURE YOU:  Understand these instructions.  Will watch your condition.  Will get help right away if you are not doing well or get worse. Document Released: 10/17/2010 Document Revised: 09/09/2011 Document Reviewed: 10/17/2010 [facility] Patient Information 2014 [place], [place].   If your headache worsens and you have new symptoms, seek care at the emergency depa

## 2013-10-18 NOTE — Progress Notes (Signed)
Subjective:    Patient ID: Deanna Haley, female    DOB: 1946/02/16, 68 y.o.   MRN: [id number]  Headache     This 68 y.o. AA female is here w/ her sign language interpreter for HTN follow-up. She is compliant w/ medication, nutrition and physical activity. Pt reports no side effects and denies diaphoresis, fatigue, CP or tightness, palpitations, SOB or DOE, cough, edema, asymmetric numbness, slurred speech, weakness or syncope.  Pt c/o frontal HA which she has awakened w/ several mornings in the last week. No hx of HA disorder. She has some blurred vision but no loss of vision in either eye. HA resolves w/ OTC NSAID. HA not accompanied by sore throat, difficulty swallowing, pare, epistaxis, CP, n/v, photophobia, dizziness or aura. Pt denies seasonal allergies. Pt has been told she "snores" by her husband who is also deaf. She also reports some mild neck pain and plans to get a new pillow to improve sleep.  Patient Active Problem List   Diagnosis Date Noted  . HTN (hypertension) 04/15/2012  . Hyperlipidemia 04/15/2012  . Deafness 04/15/2012   Prior to Admission medications   Medication Sig Start Date End Date Taking? Authorizing Provider  aspirin 81 MG tablet Take 81 mg by mouth daily.     Yes Historical Provider, MD  atorvastatin (LIPITOR) 10 MG tablet TAKE 1 TABLET (10 MG TOTAL) BY MOUTH DAILY. 03/25/13  Yes Barton Fanny, MD  calcium-vitamin D (OSCAL WITH D) 500-200 MG-UNIT per tablet Take 1 tablet by mouth daily.     Yes Historical Provider, MD  gabapentin (NEURONTIN) 300 MG capsule Take 2 capsules at bedtime. 03/29/13  Yes Barton Fanny, MD  ibuprofen (ADVIL,MOTRIN) 100 MG tablet Take 100 mg by mouth every 6 (six) hours as needed.     Yes Historical Provider, MD  losartan-hydrochlorothiazide (HYZAAR) 50-12.5 MG per tablet TAKE 1 TABLET BY MOUTH DAILY. 03/25/13  Yes Barton Fanny, MD  vitamin E 100 UNIT capsule Take 100 Units by mouth daily.   Yes Historical Provider,  MD  Blood Pressure Monitoring (BLOOD PRESSURE MONITOR/WRIST) DEVI 1 Device by Does not apply route daily.    Barton Fanny, MD  desloratadine (CLARINEX REDITAB) 5 MG disintegrating tablet Take 1 tablet (5 mg total) by mouth daily.    Barton Fanny, MD   PMHx, Surg Hx, Soc and Fam Hx reviewed.   Review of Systems  Constitutional: Negative.   HENT: Positive for congestion.        Otherwise as per HPI.  Respiratory: Negative.   Cardiovascular: Negative.   Musculoskeletal: Negative.   Neurological: Positive for headaches.       Otherwise as per HPI.      Objective:   Physical Exam  Nursing note and vitals reviewed. Constitutional: She is oriented to person, place, and time. Vital signs are normal. She appears well-developed and well-nourished. No distress.  HENT:  Head: Normocephalic and atraumatic.  Right Ear: External ear and ear canal normal.  Left Ear: External ear and ear canal normal.  Nose: Mucosal edema present. No rhinorrhea, nasal deformity or septal deviation. Right sinus exhibits frontal sinus tenderness. Right sinus exhibits no maxillary sinus tenderness. Left sinus exhibits frontal sinus tenderness. Left sinus exhibits no maxillary sinus tenderness.  Mouth/Throat: Uvula is midline and mucous membranes are normal. No oral lesions. Normal dentition. Posterior oropharyngeal erythema present.  Pt is deaf.  Eyes: Conjunctivae, EOM and lids are normal. Pupils are equal, round, and reactive to light. No  scleral icterus.  Neck: Full passive range of motion without pain. Neck supple. No spinous process tenderness and no muscular tenderness present. Decreased range of motion present. No mass and no thyromegaly present.  Cardiovascular: Normal rate.   Pulmonary/Chest: Effort normal. No respiratory distress.  Musculoskeletal:       Cervical back: She exhibits tenderness and spasm. She exhibits no bony tenderness, no deformity and no pain.       Thoracic back: Normal.        Lumbar back: Normal.  Neurological: She is alert and oriented to person, place, and time. She exhibits normal muscle tone. Coordination normal.  Skin: Skin is warm and dry. She is not diaphoretic. No erythema.  Psychiatric: She has a normal mood and affect. Her behavior is normal. Judgment and thought content normal.      Assessment & Plan:  HTN (hypertension)- Stable and controlled on current medication.  Headache(784.0)- Suspect sinus HA; trial Clarinex 5 mg  1 tablet daily. Agree w/ purchase of new pillow for better C-spine support.  Sinus congestion- Try OTC AYR saline nasal mist.  Snores- Briefly discussed sleep study which pt is not interested in at this time.  Meds ordered this encounter  Medications                 . desloratadine (CLARINEX REDITAB) 5 MG disintegrating tablet    Sig: Take 1 tablet (5 mg total) by mouth daily.    Dispense:  30 tablet    Refill:  5  . Blood Pressure Monitoring (BLOOD PRESSURE MONITOR/WRIST) DEVI    Sig: 1 Device by Does not apply route daily.    Dispense:  1 Device    Refill:  0

## 2013-11-02 ENCOUNTER — Other Ambulatory Visit: Payer: Self-pay | Admitting: Family Medicine

## 2013-11-03 ENCOUNTER — Telehealth: Payer: Self-pay | Admitting: Family Medicine

## 2013-11-03 NOTE — Telephone Encounter (Signed)
Called pt and interpreter service connects to pt and was able to speak to pt. Advised her that first req we got from pharm was yesterday and RFs were sent in then. Advised that I called pharm to check to make sure they got it and was told it is ready for her to p/up. Pt thanked me.

## 2013-11-03 NOTE — Telephone Encounter (Signed)
Patient is calling using interpreter services. States that she needs a refill on her Lipitor RX. States that she has called numerous times and left messages about it but I do not see any documentation of that in the chart. Tried to gather as much information as possible however the patient and interpreting services hung up before I could get ask how long she has been out and get a good phone number.

## 2013-12-16 ENCOUNTER — Encounter: Payer: BC Managed Care – PPO | Admitting: Family Medicine

## 2013-12-30 ENCOUNTER — Encounter: Payer: BC Managed Care – PPO | Admitting: Family Medicine

## 2014-01-04 ENCOUNTER — Emergency Department (HOSPITAL_COMMUNITY): Payer: Medicare Other

## 2014-01-04 ENCOUNTER — Encounter (HOSPITAL_COMMUNITY): Payer: Self-pay | Admitting: Emergency Medicine

## 2014-01-04 ENCOUNTER — Emergency Department (HOSPITAL_COMMUNITY)
Admission: EM | Admit: 2014-01-04 | Discharge: 2014-01-04 | Disposition: A | Discharge status: Home / Self Care | Payer: Medicare Other | Attending: Emergency Medicine | Admitting: Emergency Medicine

## 2014-01-04 DIAGNOSIS — M129 Arthropathy, unspecified: Secondary | ICD-10-CM | POA: Insufficient documentation

## 2014-01-04 DIAGNOSIS — IMO0001 Reserved for inherently not codable concepts without codable children: Secondary | ICD-10-CM | POA: Insufficient documentation

## 2014-01-04 DIAGNOSIS — J069 Acute upper respiratory infection, unspecified: Secondary | ICD-10-CM | POA: Insufficient documentation

## 2014-01-04 DIAGNOSIS — Z79899 Other long term (current) drug therapy: Secondary | ICD-10-CM | POA: Insufficient documentation

## 2014-01-04 DIAGNOSIS — R509 Fever, unspecified: Secondary | ICD-10-CM | POA: Insufficient documentation

## 2014-01-04 DIAGNOSIS — I10 Essential (primary) hypertension: Secondary | ICD-10-CM | POA: Insufficient documentation

## 2014-01-04 DIAGNOSIS — Z7982 Long term (current) use of aspirin: Secondary | ICD-10-CM | POA: Insufficient documentation

## 2014-01-04 LAB — RAPID STREP SCREEN (MED CTR MEBANE ONLY): Streptococcus, Group A Screen (Direct): NEGATIVE

## 2014-01-04 MED ORDER — AZITHROMYCIN 250 MG PO TABS
500.0000 mg | ORAL_TABLET | Freq: Once | ORAL | Status: AC
  Administered 2014-01-04: 500 mg via ORAL
  Filled 2014-01-04: qty 2

## 2014-01-04 MED ORDER — ALBUTEROL SULFATE (2.5 MG/3ML) 0.083% IN NEBU
5.0000 mg | INHALATION_SOLUTION | Freq: Once | RESPIRATORY_TRACT | Status: AC
  Administered 2014-01-04: 5 mg via RESPIRATORY_TRACT
  Filled 2014-01-04: qty 6

## 2014-01-04 MED ORDER — IBUPROFEN 800 MG PO TABS
800.0000 mg | ORAL_TABLET | Freq: Once | ORAL | Status: AC
  Administered 2014-01-04: 800 mg via ORAL
  Filled 2014-01-04: qty 1

## 2014-01-04 MED ORDER — AZITHROMYCIN 250 MG PO TABS: 250.0000 mg | ORAL_TABLET | Freq: Every day | ORAL | Status: DC

## 2014-01-04 MED ORDER — HYDROCOD POLST-CHLORPHEN POLST 10-8 MG/5ML PO LQCR: 5.0000 mL | Freq: Two times a day (BID) | ORAL | Status: DC

## 2014-01-04 NOTE — ED Provider Notes (Signed)
CSN: [id number]     Arrival date & time 01/04/14  0052 History   First MD Initiated Contact with Patient 01/04/14 0308     Chief Complaint  Patient presents with  . Cough   HPI  History provided by the patient through a sign language interpreter. Patient is a 68 year old female with history of hypertension, arthritis and deafness who presents with symptoms of fever, bodyaches, cough  shortness of breath. Symptoms first began Friday with non-productive cough and sore throat. Patient was trying to use some over-the-counter cough and cold medicine that did not have any improvements. Since that time she has had worsened symptoms with some shortness of breath and general body aches and pain. She also reports having discharge from her bilateral eyes with crusting and slight dryness and pain especially in the mornings. She has had difficulty sleeping due to the pain and cough. Denies any episodes of nausea or vomiting. No diarrhea. Denies any sick contacts. No recent travel.   Past Medical History  Diagnosis Date  . Arthritis   . Hypertension    Past Surgical History  Procedure Laterality Date  . Abdominal hysterectomy      1994  . Colonoscopy    . Polypectomy    . Mandible surgery  1986    per patient jaws lockup   Family History  Problem Relation Age of Onset  . Diabetes Sister   . Diabetes Brother    History  Substance Use Topics  . Smoking status: Never Smoker   . Smokeless tobacco: Never Used  . Alcohol Use: 0.6 oz/week    1 Glasses of wine per week   OB History   Grav Para Term Preterm Abortions TAB SAB Ect Mult Living                 Review of Systems  Constitutional: Positive for fever and chills.  HENT: Positive for congestion and sore throat. Negative for rhinorrhea.   Respiratory: Positive for cough and shortness of breath.   Cardiovascular: Negative for chest pain.  Gastrointestinal: Negative for nausea, vomiting, abdominal pain and diarrhea.  Musculoskeletal:  Positive for myalgias.  Skin: Negative for rash.  All other systems reviewed and are negative.     Allergies  Review of patient's allergies indicates no known allergies.  Home Medications   Prior to Admission medications   Medication Sig Start Date End Date Taking? Authorizing Provider  aspirin 81 MG tablet Take 81 mg by mouth daily.     Yes Historical Provider, MD  atorvastatin (LIPITOR) 10 MG tablet Take 10 mg by mouth daily at 6 PM.   Yes Historical Provider, MD  calcium-vitamin D (OSCAL WITH D) 500-200 MG-UNIT per tablet Take 1 tablet by mouth daily.     Yes Historical Provider, MD  desloratadine (CLARINEX REDITAB) 5 MG disintegrating tablet Take 1 tablet (5 mg total) by mouth daily. 10/15/13  Yes Barton Fanny, MD  gabapentin (NEURONTIN) 300 MG capsule Take 2 capsules at bedtime. 03/29/13  Yes Barton Fanny, MD  ibuprofen (ADVIL,MOTRIN) 100 MG tablet Take 100 mg by mouth every 6 (six) hours as needed.     Yes Historical Provider, MD  losartan-hydrochlorothiazide (HYZAAR) 50-12.5 MG per tablet Take 1 tablet by mouth daily.   Yes Historical Provider, MD  vitamin E 100 UNIT capsule Take 100 Units by mouth daily.   Yes Historical Provider, MD   BP 127/79  Pulse 102  Temp(Src) 99.1 F (37.3 C) (Oral)  Resp 27  SpO2 92% Physical Exam  Nursing note and vitals reviewed. Constitutional: She is oriented to person, place, and time. She appears well-developed and well-nourished. No distress.  HENT:  Head: Normocephalic.  Right Ear: Tympanic membrane normal.  Left Ear: Tympanic membrane normal.  Mouth/Throat: Oropharynx is clear and moist.  Eyes: EOM are normal. Pupils are equal, round, and reactive to light. Right eye exhibits discharge. Left eye exhibits discharge.  Neck: Normal range of motion. Neck supple.  No meningeal signs  Cardiovascular: Normal rate and regular rhythm.   Pulmonary/Chest: Effort normal. No respiratory distress. She has wheezes. She has no rales.   There is slight expiratory coarse wheezing.  Abdominal: Soft. There is no tenderness. There is no rebound and no guarding.  Musculoskeletal: Normal range of motion.  Lymphadenopathy:    She has no cervical adenopathy.  Neurological: She is alert and oriented to person, place, and time.  Skin: Skin is warm and dry. No rash noted.  Psychiatric: She has a normal mood and affect. Her behavior is normal.    ED Course  Procedures   COORDINATION OF CARE:  Nursing notes reviewed. Vital signs reviewed. Initial pt interview and examination performed.   Filed Vitals:   01/04/14 0245 01/04/14 0300 01/04/14 0315 01/04/14 0330  BP: 109/63 116/71 119/70 127/79  Pulse: 101 104 102 102  Temp:      TempSrc:      Resp: 28 25 32 27  SpO2: 95% 97% 95% 92%    3:41 AM-patient seen and evaluated. Patient well appearing in no acute distress. Does not appear severely ill or toxic.  Patient having slight improvement after breathing treatment. Fever and heart rate improved after treatment with Tylenol and ibuprofen. Patient is drinking fluids regularly. Currently having normal respirations and O2 sats on room air. Patient is feeling well and requesting to go home. Strict return precautions given.  Treatment plan initiated: Medications  ibuprofen (ADVIL,MOTRIN) tablet 800 mg (800 mg Oral Given 01/04/14 0349)  albuterol (PROVENTIL) (2.5 MG/3ML) 0.083% nebulizer solution 5 mg (5 mg Nebulization Given 01/04/14 0349)   Results for orders placed during the hospital encounter of 01/04/14  RAPID STREP SCREEN      Result Value Ref Range   Streptococcus, Group A Screen (Direct) NEGATIVE  NEGATIVE        Imaging Review Dg Chest 2 View  01/04/2014   CLINICAL DATA:  Shortness of breath.  EXAM: CHEST  2 VIEW  COMPARISON:  None.  FINDINGS: Low lung volumes noted. Heart size is at the upper limits of normal. No evidence pulmonary infiltrate or pleural effusion.  IMPRESSION: Low lung volumes.  No active disease.    Electronically Signed   By: Earle Gell M.D.   On: 01/04/2014 01:40     MDM   Final diagnoses:  Fever, unspecified fever cause  URI (upper respiratory infection)         Martie Lee, PA-C 01/04/14 [phone]

## 2014-01-04 NOTE — ED Notes (Addendum)
Pt states she is been having body ache since Friday pt states is sore on her head and throat, having problems to swallow because the pain, pt refers she is having 10/10 pain and having pus on her eyes. Pt having difficulty to sleep.

## 2014-01-04 NOTE — Discharge Instructions (Signed)
You were seen and evaluated for your cough, congestion and sore throat. Your testing and x-ray have not shown any signs for a concerning cause of your symptoms. Please use the medications prescribed to help with your cough and infection. Followup with your doctor later today for continued evaluation and treatment. Drink plenty of water to stay hydrated. Take Tylenol and ibuprofen for any fever or pain.    Upper Respiratory Infection, Adult An upper respiratory infection (URI) is also sometimes known as the common cold. The upper respiratory tract includes the nose, sinuses, throat, trachea, and bronchi. Bronchi are the airways leading to the lungs. Most people improve within 1 week, but symptoms can last up to 2 weeks. A residual cough may last even longer.  CAUSES Many different viruses can infect the tissues lining the upper respiratory tract. The tissues become irritated and inflamed and often become very moist. Mucus production is also common. A cold is contagious. You can easily spread the virus to others by oral contact. This includes kissing, sharing a glass, coughing, or sneezing. Touching your mouth or nose and then touching a surface, which is then touched by another person, can also spread the virus. SYMPTOMS  Symptoms typically develop 1 to 3 days after you come in contact with a cold virus. Symptoms vary from person to person. They may include:  Runny nose.  Sneezing.  Nasal congestion.  Sinus irritation.  Sore throat.  Loss of voice (laryngitis).  Cough.  Fatigue.  Muscle aches.  Loss of appetite.  Headache.  Low-grade fever. DIAGNOSIS  You might diagnose your own cold based on familiar symptoms, since most people get a cold 2 to 3 times a year. Your caregiver can confirm this based on your exam. Most importantly, your caregiver can check that your symptoms are not due to another disease such as strep throat, sinusitis, pneumonia, asthma, or epiglottitis. Blood tests,  throat tests, and X-rays are not necessary to diagnose a common cold, but they may sometimes be helpful in excluding other more serious diseases. Your caregiver will decide if any further tests are required. RISKS AND COMPLICATIONS  You may be at risk for a more severe case of the common cold if you smoke cigarettes, have chronic heart disease (such as heart failure) or lung disease (such as asthma), or if you have a weakened immune system. The very young and very old are also at risk for more serious infections. Bacterial sinusitis, middle ear infections, and bacterial pneumonia can complicate the common cold. The common cold can worsen asthma and chronic obstructive pulmonary disease (COPD). Sometimes, these complications can require emergency medical care and may be life-threatening. PREVENTION  The best way to protect against getting a cold is to practice good hygiene. Avoid oral or hand contact with people with cold symptoms. Wash your hands often if contact occurs. There is no clear evidence that vitamin C, vitamin E, echinacea, or exercise reduces the chance of developing a cold. However, it is always recommended to get plenty of rest and practice good nutrition. TREATMENT  Treatment is directed at relieving symptoms. There is no cure. Antibiotics are not effective, because the infection is caused by a virus, not by bacteria. Treatment may include:  Increased fluid intake. Sports drinks offer valuable electrolytes, sugars, and fluids.  Breathing heated mist or steam (vaporizer or shower).  Eating chicken soup or other clear broths, and maintaining good nutrition.  Getting plenty of rest.  Using gargles or lozenges for comfort.  Controlling fevers  with ibuprofen or acetaminophen as directed by your caregiver.  Increasing usage of your inhaler if you have asthma. Zinc gel and zinc lozenges, taken in the first 24 hours of the common cold, can shorten the duration and lessen the severity of  symptoms. Pain medicines may help with fever, muscle aches, and throat pain. A variety of non-prescription medicines are available to treat congestion and runny nose. Your caregiver can make recommendations and may suggest nasal or lung inhalers for other symptoms.  HOME CARE INSTRUCTIONS   Only take over-the-counter or prescription medicines for pain, discomfort, or fever as directed by your caregiver.  Use a warm mist humidifier or inhale steam from a shower to increase air moisture. This may keep secretions moist and make it easier to breathe.  Drink enough water and fluids to keep your urine clear or pale yellow.  Rest as needed.  Return to work when your temperature has returned to normal or as your caregiver advises. You may need to stay home longer to avoid infecting others. You can also use a face mask and careful hand washing to prevent spread of the virus. SEEK MEDICAL CARE IF:   After the first few days, you feel you are getting worse rather than better.  You need your caregiver's advice about medicines to control symptoms.  You develop chills, worsening shortness of breath, or brown or red sputum. These may be signs of pneumonia.  You develop yellow or brown nasal discharge or pain in the face, especially when you bend forward. These may be signs of sinusitis.  You develop a fever, swollen neck glands, pain with swallowing, or white areas in the back of your throat. These may be signs of strep throat. SEEK IMMEDIATE MEDICAL CARE IF:   You have a fever.  You develop severe or persistent headache, ear pain, sinus pain, or chest pain.  You develop wheezing, a prolonged cough, cough up blood, or have a change in your usual mucus (if you have chronic lung disease).  You develop sore muscles or a stiff neck. Document Released: 12/11/2000 Document Revised: 09/09/2011 Document Reviewed: 10/19/2010 [facility] Patient Information 2015 [place], [place]. This information is not intended  to replace advice given to you by your health care provider. Make sure you discuss any questions you have with your health care provider.

## 2014-01-04 NOTE — ED Notes (Addendum)
Presents with cough, worse at night dry and hacking. Pt is deaf. Interpreter called, awaiting call back. Denies fevers. Bilateral breath sounds diminished.  TEmp at triage 100.2. Non productive cough.

## 2014-01-04 NOTE — ED Provider Notes (Signed)
Medical screening examination/treatment/procedure(s) were performed by non-physician practitioner and as supervising physician I was immediately available for consultation/collaboration.   EKG Interpretation None       Kalman Drape, MD 01/04/14 [phone]

## 2014-01-04 NOTE — ED Notes (Signed)
Sing interpreter at the bedside.  

## 2014-01-05 LAB — CULTURE, GROUP A STREP

## 2014-03-01 ENCOUNTER — Encounter: Payer: Medicare Other | Admitting: Family Medicine

## 2014-03-24 ENCOUNTER — Other Ambulatory Visit: Payer: Self-pay | Admitting: Family Medicine

## 2014-03-27 ENCOUNTER — Other Ambulatory Visit: Payer: Self-pay | Admitting: Family Medicine

## 2014-04-21 ENCOUNTER — Other Ambulatory Visit: Payer: Self-pay

## 2014-04-21 DIAGNOSIS — Z1231 Encounter for screening mammogram for malignant neoplasm of breast: Secondary | ICD-10-CM

## 2014-04-28 ENCOUNTER — Ambulatory Visit (INDEPENDENT_AMBULATORY_CARE_PROVIDER_SITE_OTHER): Payer: Medicare Other | Admitting: Family Medicine

## 2014-04-28 ENCOUNTER — Encounter: Payer: Self-pay | Admitting: Family Medicine

## 2014-04-28 VITALS — BP 108/74 | HR 80 | Temp 98.2°F | Resp 16 | Ht 60.5 in | Wt 167.8 lb

## 2014-04-28 DIAGNOSIS — Z23 Encounter for immunization: Secondary | ICD-10-CM

## 2014-04-28 DIAGNOSIS — E785 Hyperlipidemia, unspecified: Secondary | ICD-10-CM

## 2014-04-28 DIAGNOSIS — Z0000 Encounter for general adult medical examination without abnormal findings: Secondary | ICD-10-CM

## 2014-04-28 DIAGNOSIS — I10 Essential (primary) hypertension: Secondary | ICD-10-CM

## 2014-04-28 LAB — CBC WITH DIFFERENTIAL/PLATELET
Basophils Absolute: 0 10*3/uL (ref 0.0–0.1)
Basophils Relative: 0 % (ref 0–1)
Eosinophils Absolute: 0.2 10*3/uL (ref 0.0–0.7)
Eosinophils Relative: 2 % (ref 0–5)
HEMATOCRIT: 36.8 % (ref 36.0–46.0)
HEMOGLOBIN: 12.1 g/dL (ref 12.0–15.0)
LYMPHS ABS: 2.8 10*3/uL (ref 0.7–4.0)
Lymphocytes Relative: 34 % (ref 12–46)
MCH: 26.5 pg (ref 26.0–34.0)
MCHC: 32.9 g/dL (ref 30.0–36.0)
MCV: 80.5 fL (ref 78.0–100.0)
MONO ABS: 0.4 10*3/uL (ref 0.1–1.0)
MONOS PCT: 5 % (ref 3–12)
Neutro Abs: 4.8 10*3/uL (ref 1.7–7.7)
Neutrophils Relative %: 59 % (ref 43–77)
Platelets: 285 10*3/uL (ref 150–400)
RBC: 4.57 MIL/uL (ref 3.87–5.11)
RDW: 16.2 % — ABNORMAL HIGH (ref 11.5–15.5)
WBC: 8.2 10*3/uL (ref 4.0–10.5)

## 2014-04-28 LAB — POCT URINALYSIS DIPSTICK
BILIRUBIN UA: NEGATIVE
Blood, UA: NEGATIVE
GLUCOSE UA: NEGATIVE
Ketones, UA: NEGATIVE
Leukocytes, UA: NEGATIVE
Nitrite, UA: NEGATIVE
Protein, UA: NEGATIVE
SPEC GRAV UA: 1.01
Urobilinogen, UA: 0.2
pH, UA: 6

## 2014-04-28 LAB — COMPLETE METABOLIC PANEL WITH GFR
ALT: 15 U/L (ref 0–35)
AST: 20 U/L (ref 0–37)
Albumin: 4.2 g/dL (ref 3.5–5.2)
Alkaline Phosphatase: 91 U/L (ref 39–117)
BUN: 15 mg/dL (ref 6–23)
CALCIUM: 10.1 mg/dL (ref 8.4–10.5)
CHLORIDE: 102 meq/L (ref 96–112)
CO2: 27 meq/L (ref 19–32)
Creat: 0.91 mg/dL (ref 0.50–1.10)
GFR, EST AFRICAN AMERICAN: 76 mL/min
GFR, Est Non African American: 65 mL/min
Glucose, Bld: 87 mg/dL (ref 70–99)
Potassium: 3.7 mEq/L (ref 3.5–5.3)
Sodium: 137 mEq/L (ref 135–145)
Total Bilirubin: 0.5 mg/dL (ref 0.2–1.2)
Total Protein: 7.6 g/dL (ref 6.0–8.3)

## 2014-04-28 LAB — LIPID PANEL
Cholesterol: 200 mg/dL (ref 0–200)
HDL: 72 mg/dL (ref >39)
LDL CALC: 103 mg/dL — AB (ref 0–99)
TRIGLYCERIDES: 125 mg/dL (ref <150)
Total CHOL/HDL Ratio: 2.8 Ratio
VLDL: 25 mg/dL (ref 0–40)

## 2014-04-28 MED ORDER — ATORVASTATIN CALCIUM 10 MG PO TABS: 10.0000 mg | ORAL_TABLET | Freq: Every day | ORAL | Status: DC

## 2014-04-28 MED ORDER — LOSARTAN POTASSIUM-HCTZ 50-12.5 MG PO TABS: 1.0000 | ORAL_TABLET | Freq: Every day | ORAL | Status: DC

## 2014-04-28 MED ORDER — GABAPENTIN 300 MG PO CAPS: ORAL_CAPSULE | ORAL | Status: DC

## 2014-04-28 NOTE — Patient Instructions (Signed)

## 2014-04-28 NOTE — Progress Notes (Signed)
Quick Note:  Please notify pt that results are normal.   Provide pt with copy of labs. ______ 

## 2014-04-28 NOTE — Progress Notes (Signed)
Subjective:    Patient ID: Deanna Haley, female    DOB: [birth year], 67 y.o.   MRN: [id number]  HPI  This 68 y.o. AA female is here for [facility] Subsequent Wellness exam. She is here w/ sign interpreter, Coral. Pt has been compliant w/ medications w/o adverse effects.   HCM: MM- Current (Aug 2014- negative); pt prefers to have next MMG in 2016.           PAP- NA.           CRS-  Current (July 2012; negative w/ recall in 5 years).           IMM- Current.           Vision- Annually w/ an eye care specialist.  Patient Active Problem List   Diagnosis Date Noted  . HTN (hypertension) 04/15/2012  . Hyperlipidemia 04/15/2012  . Deafness 04/15/2012    Prior to Admission medications   Medication Sig Start Date End Date Taking? Authorizing Provider  aspirin 81 MG tablet Take 81 mg by mouth daily.     Yes Historical Provider, MD  atorvastatin (LIPITOR) 10 MG tablet Take 1 tablet (10 mg total) by mouth daily.   Yes Barton Fanny, MD  calcium-vitamin D (OSCAL WITH D) 500-200 MG-UNIT per tablet Take 1 tablet by mouth daily.     Yes Historical Provider, MD  gabapentin (NEURONTIN) 300 MG capsule Take 2 capsules at bedtime.   Yes Barton Fanny, MD  ibuprofen (ADVIL,MOTRIN) 100 MG tablet Take 100 mg by mouth every 6 (six) hours as needed.     Yes Historical Provider, MD  losartan-hydrochlorothiazide (HYZAAR) 50-12.5 MG per tablet Take 1 tablet by mouth daily.   Yes Barton Fanny, MD  vitamin E 100 UNIT capsule Take 100 Units by mouth daily.   Yes Historical Provider, MD  desloratadine (CLARINEX REDITAB) 5 MG disintegrating tablet Take 1 tablet (5 mg total) by mouth daily. 10/15/13   Barton Fanny, MD    History   Social History  . Marital Status: Married    Spouse Name: N/A    Number of Children: N/A  . Years of Education: N/A   Occupational History  . Not on file.   Social History Main Topics  . Smoking status: Never Smoker   . Smokeless tobacco: Never Used  .  Alcohol Use: 0.6 oz/week    1 Glasses of wine per week  . Drug Use: No  . Sexual Activity: Not on file   Other Topics Concern  . Not on file   Social History Narrative  . No narrative on file    Family History  Problem Relation Age of Onset  . Diabetes Sister   . Diabetes Brother     Review of Systems  HENT:       Jaw pain where TMJ feels like it is going to "lock up". Hx of surgery back in the mid-1980s.  Musculoskeletal:       R elbow soreness; recent cruise and pulled suitcase around as well as doing routine housework >> pt is R-handed.  All other systems reviewed and are negative.     Objective:   Physical Exam  Nursing note and vitals reviewed. Constitutional: She is oriented to person, place, and time. Vital signs are normal. She appears well-developed and well-nourished.  HENT:  Head: Normocephalic and atraumatic.  Right Ear: Tympanic membrane, external ear and ear canal normal. Decreased hearing is noted.  Left Ear: Tympanic membrane, external  ear and ear canal normal. Decreased hearing is noted.  Nose: Nose normal. No mucosal edema, nasal deformity or septal deviation.  Mouth/Throat: Uvula is midline, oropharynx is clear and moist and mucous membranes are normal. She has dentures. No oral lesions. No uvula swelling.  Eyes: Conjunctivae, EOM and lids are normal. Pupils are equal, round, and reactive to light. No scleral icterus.  Fundoscopic exam:      The right eye shows no papilledema. The right eye shows red reflex.       The left eye shows no papilledema. The left eye shows red reflex.  Neck: Trachea normal, normal range of motion, full passive range of motion without pain and phonation normal. Neck supple. No JVD present. No spinous process tenderness and no muscular tenderness present. Carotid bruit is not present. No mass and no thyromegaly present.  Cardiovascular: Normal rate, regular rhythm, S1 normal, S2 normal, normal heart sounds, intact distal pulses and  normal pulses.   No extrasystoles are present. PMI is not displaced.  Exam reveals no gallop and no friction rub.   No murmur heard. Pulmonary/Chest: Effort normal and breath sounds normal. No respiratory distress. She has no decreased breath sounds. Right breast exhibits no inverted nipple, no mass, no nipple discharge, no skin change and no tenderness. Left breast exhibits no inverted nipple, no mass, no nipple discharge, no skin change and no tenderness. Breasts are symmetrical.  Abdominal: Soft. Normal appearance and bowel sounds are normal. She exhibits no distension, no abdominal bruit, no pulsatile midline mass and no mass. There is no hepatosplenomegaly. There is no tenderness. There is no guarding and no CVA tenderness.  Genitourinary:  Deferred.  Musculoskeletal:       Right elbow: She exhibits normal range of motion, no swelling, no effusion and no deformity. Tenderness found. Lateral epicondyle tenderness noted. No medial epicondyle and no olecranon process tenderness noted.       Left elbow: Normal.       Cervical back: Normal.       Thoracic back: Normal.       Lumbar back: Normal.  Remainder of exam unremarkable.  Lymphadenopathy:       Head (right side): No submental, no submandibular, no tonsillar, no preauricular, no posterior auricular and no occipital adenopathy present.       Head (left side): No submental, no submandibular, no tonsillar, no preauricular, no posterior auricular and no occipital adenopathy present.    She has no cervical adenopathy.    She has no axillary adenopathy.       Right: No inguinal and no supraclavicular adenopathy present.       Left: No inguinal and no supraclavicular adenopathy present.  Neurological: She is alert and oriented to person, place, and time. She has normal strength. She displays no atrophy. No cranial nerve deficit or sensory deficit. She exhibits normal muscle tone. She displays a negative Romberg sign. Coordination and gait normal.    Reflex Scores:      Tricep reflexes are 1+ on the right side and 1+ on the left side.      Bicep reflexes are 2+ on the right side and 2+ on the left side.      Brachioradialis reflexes are 1+ on the right side and 1+ on the left side.      Patellar reflexes are 2+ on the right side and 2+ on the left side. Skin: Skin is warm, dry and intact. No ecchymosis, no lesion and no rash noted. She  is not diaphoretic. No cyanosis or erythema. Nails show no clubbing.  Psychiatric: She has a normal mood and affect. Her speech is normal and behavior is normal. Judgment and thought content normal. Cognition and memory are normal.       Assessment & Plan:  .Encounter for Medicare annual wellness exam  Essential hypertension - Stable and controlled on current medication. Plan: COMPLETE METABOLIC PANEL WITH GFR, CBC with Differential, POCT urinalysis dipstick  Hyperlipidemia - Continue atorvastatin at current dose. Plan: Lipid panel  Need for prophylactic vaccination against Streptococcus pneumoniae (pneumococcus) - Plan: Pneumococcal conjugate vaccine 13-valent IM   Meds ordered this encounter  Medications  . losartan-hydrochlorothiazide (HYZAAR) 50-12.5 MG per tablet    Sig: Take 1 tablet by mouth daily.    Dispense:  90 tablet    Refill:  3  . atorvastatin (LIPITOR) 10 MG tablet    Sig: Take 1 tablet (10 mg total) by mouth daily.    Dispense:  90 tablet    Refill:  3  . gabapentin (NEURONTIN) 300 MG capsule    Sig: Take 2 capsules at bedtime.    Dispense:  180 capsule    Refill:  3

## 2014-05-10 ENCOUNTER — Ambulatory Visit: Payer: Medicare Other

## 2014-05-20 ENCOUNTER — Ambulatory Visit
Admission: RE | Admit: 2014-05-20 | Discharge: 2014-05-20 | Disposition: A | Discharge status: Home / Self Care | Payer: Medicare Other | Source: Ambulatory Visit

## 2014-05-20 DIAGNOSIS — Z1231 Encounter for screening mammogram for malignant neoplasm of breast: Secondary | ICD-10-CM

## 2014-06-23 ENCOUNTER — Encounter: Payer: Self-pay | Admitting: *Deleted

## 2014-07-04 ENCOUNTER — Encounter: Payer: Self-pay | Admitting: Cardiology

## 2014-07-04 ENCOUNTER — Ambulatory Visit (INDEPENDENT_AMBULATORY_CARE_PROVIDER_SITE_OTHER): Payer: BC Managed Care – PPO | Admitting: Cardiology

## 2014-07-04 VITALS — BP 130/76 | HR 70 | Ht 61.0 in | Wt 171.8 lb

## 2014-07-04 DIAGNOSIS — E785 Hyperlipidemia, unspecified: Secondary | ICD-10-CM

## 2014-07-04 DIAGNOSIS — I10 Essential (primary) hypertension: Secondary | ICD-10-CM

## 2014-07-04 DIAGNOSIS — Z8249 Family history of ischemic heart disease and other diseases of the circulatory system: Secondary | ICD-10-CM

## 2014-07-04 NOTE — Patient Instructions (Signed)
Your physician wants you to follow-up in: Deanna Haley will receive a reminder letter in the mail two months in advance. If you don't receive a letter, please call our office to schedule the follow-up appointment.

## 2014-07-05 ENCOUNTER — Encounter: Payer: Self-pay | Admitting: Cardiology

## 2014-07-05 DIAGNOSIS — Z8249 Family history of ischemic heart disease and other diseases of the circulatory system: Secondary | ICD-10-CM | POA: Insufficient documentation

## 2014-07-05 NOTE — Assessment & Plan Note (Signed)
Lab Results  Component Value Date   CHOL 200 04/28/2014   HDL 72 04/28/2014   LDLCALC 103* 04/28/2014   LDLDIRECT 98 03/25/2013   TRIG 125 04/28/2014   CHOLHDL 2.8 04/28/2014   Essentially at goal on standard / stable dose of Lipitor. This is an improvement from 2010 when total cholesterol was 233, LDL 130 and HDL 87, triglycerides 80

## 2014-07-05 NOTE — Assessment & Plan Note (Signed)
Excellent control on combination ARB/HCTZ. This is the same dose that she was on during her visit 3 years ago

## 2014-07-05 NOTE — Assessment & Plan Note (Addendum)
No active cardiac symptoms. Risk factors are well controlled as indicated above. Baseline evaluation several years ago was negative for ischemia on Myoview with normal cardiac function on echo. In the absence of additional symptoms, would simply monitor. As long as she continues to the active I don't really see the need to screen with stress test.

## 2014-07-05 NOTE — Progress Notes (Signed)
PCP: Ellsworth Lennox, MD  Clinic Note: Chief Complaint  Patient presents with  . Follow-up    last seen with Dr.Solomon in 2012. no chest pain, dizziness or swelling.     HPI: Deanna Haley is a 69 y.o. female with a PMH below who presents today for almost 3 year followup Last seen by me in March 2013. She was a former patient of Dr. Tery Sanfilippo and was evaluated for chest discomfort and exertional dyspnea with an echocardiogram and a Myoview stress test back in 2012. She has a relatively significant family history of CAD as well as CAD risk factors.  Past Medical History  Diagnosis Date  . Arthritis   . Deaf   . Essential hypertension 04/15/2012    Evaluated at [facility]- Aug 2010 with Dr. Tery Sanfilippo   . Hyperlipidemia with target LDL less than 100 04/15/2012    Prior Cardiac Evaluation and Past Surgical History: Past Surgical History  Procedure Laterality Date  . Abdominal hysterectomy      1994  . Colonoscopy    . Polypectomy    . Mandible surgery  1986    per patient jaws lockup  . Doppler echocardiography  08/30/2010    left ventricle is normal,EF= >55%  . Nm myocar perf wall motion  08/29/2010    POST STRESS- EF 77% NO SIGNIFICANT WALL MOTION ABNORMALITIES,EXERCISE CAPCITY 7 METS ,LOW RISK SCAN    Interval History: obtained using sign language interpreter She overall seems to be doing quite well without any major complaints. He only thing she really notes is that she has intermittent hot flashes. 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 With this she does quite a bit of exertion mopping and vacuuming. She also climbs up and down stairs quite a bit.  She does not do any routine exercise however. With any activity she denies any anginal type chest tightness or pressure with rest or exertion. No resting or exertional dyspnea. She denies any heart failure symptoms of PND, orthopnea or edema.  No palpitations,  lightheadedness, dizziness, weakness, syncope/near syncope, or TIA/amaurosis fugax symptoms.  ROS: A comprehensive was performed. Review of Systems  Constitutional: Negative for weight loss and malaise/fatigue.  HENT: Negative for nosebleeds.   Eyes: Negative for blurred vision and double vision.  Cardiovascular: Negative.        Per history of present illness  Gastrointestinal: Negative for blood in stool and melena.  Genitourinary: Negative for hematuria.  Musculoskeletal: Positive for joint pain. Negative for myalgias.  Neurological: Negative for dizziness, focal weakness and loss of consciousness.       Is completely deaf  All other systems reviewed and are negative.   Current Outpatient Prescriptions on File Prior to Visit  Medication Sig Dispense Refill  . aspirin 81 MG tablet Take 81 mg by mouth daily.      [name] atorvastatin (LIPITOR) 10 MG tablet Take 1 tablet (10 mg total) by mouth daily. 90 tablet 3  . calcium-vitamin D (OSCAL WITH D) 500-200 MG-UNIT per tablet Take 1 tablet by mouth daily.      [name] gabapentin (NEURONTIN) 300 MG capsule Take 2 capsules at bedtime. 180 capsule 3  . ibuprofen (ADVIL,MOTRIN) 100 MG tablet Take 100 mg by mouth every 6 (six) hours as needed.      [name] losartan-hydrochlorothiazide (HYZAAR) 50-12.5 MG per tablet Take 1 tablet by mouth daily. 90 tablet 3  . vitamin E 100 UNIT capsule Take 100 Units by mouth daily.  No current facility-administered medications on file prior to visit.   No Known Allergies  Social History:  reports that she has never smoked. She has never used smokeless tobacco. She reports that she drinks about 0.6 oz of alcohol per week. She reports that she does not use illicit drugs.   Family History: family history includes Diabetes in her brother and sister; Heart attack in her father, mother, sister, and sister. There is no history of Anemia.  Wt Readings from Last 3 Encounters:  07/04/14 171 lb 12.8 oz (77.928 kg)  04/28/14 167  lb 12.8 oz (76.114 kg)  10/15/13 177 lb 6.4 oz (80.468 kg)   PHYSICAL EXAM BP 130/76 mmHg  Pulse 70  Ht 5\' 1"  (1.549 m)  Wt 171 lb 12.8 oz (77.928 kg)  BMI 32.48 kg/m2 General appearance: alert, cooperative, appears stated age, no distress and Mildly obese; death - communicates using sign language interpreter HEENT: Welcome/AT, EOMI, MMM, anicteric sclerae Neck: no adenopathy, no carotid bruit and no JVD Lungs: clear to auscultation bilaterally, normal percussion bilaterally and non-labored Heart: regular rate and rhythm, S1, S2 normal, no murmur, click, rub or gallop; nondisplaced PMI Abdomen: soft, non-tender; bowel sounds normal; no masses,  no organomegaly; no HJR; mild truncal obesity Extremities: extremities normal, atraumatic, no cyanosis, or edema Pulses: 2+ and symmetric;  Skin: mobility and turgor normal Neurologic: Mental status: Alert, oriented, thought content appropriate Cranial nerves: normal (II-XII grossly intact)   Adult ECG Report  Rate: 70 ;  Rhythm: normal sinus rhythm  Narrative Interpretation: normal EKG. Normal axis and intervals. Normal durations. Normal voltage.  Recent Labs:  Lab Results  Component Value Date   CHOL 200 04/28/2014   HDL 72 04/28/2014   LDLCALC 103* 04/28/2014   LDLDIRECT 98 03/25/2013   TRIG 125 04/28/2014   CHOLHDL 2.8 04/28/2014   Essentially at goal  ASSESSMENT / PLAN: Essential hypertension Excellent control on combination ARB/HCTZ. This is the same dose that she was on during her visit 3 years ago  Hyperlipidemia with target LDL less than 100 Lab Results  Component Value Date   CHOL 200 04/28/2014   HDL 72 04/28/2014   LDLCALC 103* 04/28/2014   LDLDIRECT 98 03/25/2013   TRIG 125 04/28/2014   CHOLHDL 2.8 04/28/2014   Essentially at goal on standard / stable dose of Lipitor. This is an improvement from 2010 when total cholesterol was 233, LDL 130 and HDL 87, triglycerides 80   Family history of premature coronary artery  disease No active cardiac symptoms. Risk factors are well controlled as indicated above. Baseline evaluation several years ago was negative for ischemia on Myoview with normal cardiac function on echo. In the absence of additional symptoms, would simply monitor. As long as she continues to the active I don't really see the need to screen with stress test.    Orders Placed This Encounter  Procedures  . EKG 12-Lead   No orders of the defined types were placed in this encounter.    Followup: 1 year   Leonie Man, M.D., M.S. Interventional Cardiologist   Pager # [phone]

## 2014-10-27 ENCOUNTER — Encounter: Payer: Self-pay | Admitting: Family Medicine

## 2014-10-27 ENCOUNTER — Ambulatory Visit (INDEPENDENT_AMBULATORY_CARE_PROVIDER_SITE_OTHER): Payer: Medicare Other | Admitting: Family Medicine

## 2014-10-27 VITALS — BP 137/76 | HR 71 | Temp 98.6°F | Resp 16 | Ht 60.5 in | Wt 159.6 lb

## 2014-10-27 DIAGNOSIS — M2550 Pain in unspecified joint: Secondary | ICD-10-CM | POA: Diagnosis not present

## 2014-10-27 DIAGNOSIS — I10 Essential (primary) hypertension: Secondary | ICD-10-CM

## 2014-10-27 DIAGNOSIS — G8929 Other chronic pain: Secondary | ICD-10-CM | POA: Diagnosis not present

## 2014-10-27 DIAGNOSIS — E785 Hyperlipidemia, unspecified: Secondary | ICD-10-CM

## 2014-10-27 LAB — ALT: ALT: 14 U/L (ref 0–35)

## 2014-10-27 LAB — BASIC METABOLIC PANEL
BUN: 17 mg/dL (ref 6–23)
CHLORIDE: 102 meq/L (ref 96–112)
CO2: 24 mEq/L (ref 19–32)
Calcium: 9.7 mg/dL (ref 8.4–10.5)
Creat: 0.84 mg/dL (ref 0.50–1.10)
Glucose, Bld: 80 mg/dL (ref 70–99)
POTASSIUM: 3.9 meq/L (ref 3.5–5.3)
SODIUM: 137 meq/L (ref 135–145)

## 2014-10-27 LAB — LDL CHOLESTEROL, DIRECT: Direct LDL: 87 mg/dL

## 2014-10-27 LAB — SEDIMENTATION RATE: Sed Rate: 30 mm/hr (ref 0–30)

## 2014-10-27 MED ORDER — NABUMETONE 500 MG PO TABS: 500.0000 mg | ORAL_TABLET | Freq: Every day | ORAL | Status: DC

## 2014-10-27 NOTE — Progress Notes (Signed)
Quick Note:  Please advise pt regarding following labs... (Note: Pt is deaf).  All recent lab tests are normal.  Mail copy of labs to pt. ______

## 2014-10-28 ENCOUNTER — Encounter: Payer: Self-pay | Admitting: Family Medicine

## 2014-10-30 NOTE — Progress Notes (Signed)
Subjective:    Patient ID: Deanna Haley, female    DOB: 06/27/1946, 69 y.o.   MRN: [id number]  HPI This pleasant 69 y.o female is here w/ her sign language interpreter. Pt has HTN, hyperlipidemia and joint pain involving hands and elbows. Pt is compliant w/ medications w/o adverse effects. Lifestyle changes have resulted in weight loss. Pt has been traveling and has minor dietary indiscretions.    Pt c/o L elbow pain; last visit she had R elbow pain due to pulling a rolling suitcase through airport. She states elbow pain is mild and is relieved w/ OTC analgesic (ibuprofen 2 tablets as needed). Topical analgesic also helps. There is no hx of acute trauma, joint redness, swelling of loss of use. She has mild hand discomfort w/o deformity of digits. She reports some generalized  morning stiffness. 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 She asks if I think she is able to return to this kind of work.  Pt does not report muscle aching, soreness or weakness (takes a statin medication).  Patient Active Problem List   Diagnosis Date Noted  . Family history of premature coronary artery disease 07/05/2014  . Essential hypertension 04/15/2012  . Hyperlipidemia with target LDL less than 100 04/15/2012  . Deafness 04/15/2012    Prior to Admission medications   Medication Sig Start Date End Date Taking? Authorizing Provider  aspirin 81 MG tablet Take 81 mg by mouth daily.     Yes Historical Provider, MD  atorvastatin (LIPITOR) 10 MG tablet Take 1 tablet (10 mg total) by mouth daily. 04/28/14  Yes Barton Fanny, MD  calcium-vitamin D (OSCAL WITH D) 500-200 MG-UNIT per tablet Take 1 tablet by mouth daily.     Yes Historical Provider, MD  gabapentin (NEURONTIN) 300 MG capsule Take 2 capsules at bedtime. 04/28/14  Yes Barton Fanny, MD  ibuprofen (ADVIL,MOTRIN) 100 MG tablet Take 100 mg by  mouth every 6 (six) hours as needed.     Yes Historical Provider, MD  losartan-hydrochlorothiazide (HYZAAR) 50-12.5 MG per tablet Take 1 tablet by mouth daily. 04/28/14  Yes Barton Fanny, MD  nabumetone (RELAFEN) 500 MG tablet Take 1 tablet (500 mg total) by mouth daily. Take with food. 10/27/14   Barton Fanny, MD  vitamin E 100 UNIT capsule Take 100 Units by mouth daily.   Yes Historical Provider, MD    Past Surgical History  Procedure Laterality Date  . Abdominal hysterectomy      1994  . Colonoscopy    . Polypectomy    . Mandible surgery  1986    per patient jaws lockup  . Doppler echocardiography  08/30/2010    left ventricle is normal,EF= >55%  . Nm myocar perf wall motion  08/29/2010    POST STRESS- EF 77% NO SIGNIFICANT WALL MOTION ABNORMALITIES,EXERCISE CAPCITY 7 METS ,LOW RISK SCAN    History   Social History  . Marital Status: Married    Spouse Name: N/A  . Number of Children: N/A  . Years of Education: N/A   Occupational History  . Not on file.   Social History Main Topics  . Smoking status: Never Smoker   . Smokeless tobacco: Never Used  . Alcohol Use: 0.6 oz/week    1 Glasses of wine per week  . Drug Use: No  . Sexual Activity: Not on file   Other  Topics Concern  . Not on file   Social History Narrative    Family History  Problem Relation Age of Onset  . Diabetes Sister   . Heart attack Sister   . Diabetes Brother   . Heart attack Mother   . Heart attack Father   . Anemia Neg Hx   . Heart attack Sister     Review of Systems  Constitutional: Negative.   Eyes: Negative for visual disturbance.  Respiratory: Negative.   Cardiovascular: Negative.   Gastrointestinal: Negative.   Musculoskeletal: Positive for arthralgias. Negative for myalgias, back pain and joint swelling.  Skin: Negative.   Neurological: Negative.   Psychiatric/Behavioral: Negative.        Objective:   Physical Exam  Constitutional: She is oriented to person,  place, and time. She appears well-developed and well-nourished. No distress.  Blood pressure 137/76, pulse 71, temperature 98.6 F (37 C), temperature source Oral, resp. rate 16, height 5' 0.5" (1.537 m), weight 159 lb 9.6 oz (72.394 kg), SpO2 99 %. Pt has lost ~18 lbs in last 12 months.  HENT:  Head: Normocephalic and atraumatic.  Right Ear: External ear normal.  Left Ear: External ear normal.  Nose: Nose normal.  Mouth/Throat: Oropharynx is clear and moist.  Eyes: Conjunctivae and EOM are normal. No scleral icterus.  Neck: Normal range of motion. Neck supple. No thyromegaly present.  Cardiovascular: Normal rate, regular rhythm and normal heart sounds.  Exam reveals no gallop and no friction rub.   No murmur heard. Pulmonary/Chest: Effort normal and breath sounds normal. No respiratory distress.  Musculoskeletal: Normal range of motion. She exhibits no edema.       Right elbow: She exhibits normal range of motion, no swelling, no effusion and no deformity. Tenderness found. Medial epicondyle tenderness noted.       Left elbow: Normal.       Right hand: She exhibits tenderness. She exhibits normal range of motion, no bony tenderness, no deformity and no swelling. Normal strength noted.       Left hand: She exhibits tenderness. She exhibits normal range of motion, no bony tenderness, no deformity and no swelling. Normal strength noted.  Remainder of exam unremarkable.  Lymphadenopathy:    She has no cervical adenopathy.  Neurological: She is alert and oriented to person, place, and time. Coordination normal.  CNs intact except pt is deaf.  Skin: Skin is warm and dry. She is not diaphoretic.  Psychiatric: She has a normal mood and affect. Her behavior is normal. Judgment and thought content normal.  Nursing note and vitals reviewed.      Assessment & Plan:  Essential hypertension - Stable and well controlled on current medication. Plan: Basic metabolic panel  Hyperlipidemia with  target LDL less than 100 - Plan: LDL Cholesterol, Direct, ALT  Chronic joint pain - Trial of Nabumetone 500 mg 1 tablet once a day w/ food. Continue topical analgesic. 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 Plan: Sedimentation Rate  Meds ordered this encounter  Medications  . nabumetone (RELAFEN) 500 MG tablet    Sig: Take 1 tablet (500 mg total) by mouth daily. Take with food.    Dispense:  30 tablet    Refill:  5

## 2015-03-07 ENCOUNTER — Encounter: Payer: Self-pay | Admitting: Physician Assistant

## 2015-03-07 ENCOUNTER — Ambulatory Visit: Payer: Medicare Other | Admitting: Physician Assistant

## 2015-03-07 ENCOUNTER — Ambulatory Visit (INDEPENDENT_AMBULATORY_CARE_PROVIDER_SITE_OTHER): Payer: Medicare Other | Admitting: Physician Assistant

## 2015-03-07 VITALS — BP 132/80 | HR 76 | Temp 98.1°F | Resp 16 | Ht 61.0 in | Wt 170.0 lb

## 2015-03-07 DIAGNOSIS — Z780 Asymptomatic menopausal state: Secondary | ICD-10-CM

## 2015-03-07 DIAGNOSIS — Z1159 Encounter for screening for other viral diseases: Secondary | ICD-10-CM

## 2015-03-07 DIAGNOSIS — E785 Hyperlipidemia, unspecified: Secondary | ICD-10-CM | POA: Diagnosis not present

## 2015-03-07 DIAGNOSIS — Z23 Encounter for immunization: Secondary | ICD-10-CM

## 2015-03-07 DIAGNOSIS — I10 Essential (primary) hypertension: Secondary | ICD-10-CM | POA: Diagnosis not present

## 2015-03-07 DIAGNOSIS — N3946 Mixed incontinence: Secondary | ICD-10-CM | POA: Diagnosis not present

## 2015-03-07 LAB — COMPREHENSIVE METABOLIC PANEL
ALK PHOS: 79 U/L (ref 33–130)
ALT: 17 U/L (ref 6–29)
AST: 22 U/L (ref 10–35)
Albumin: 4.2 g/dL (ref 3.6–5.1)
BUN: 21 mg/dL (ref 7–25)
CALCIUM: 10.4 mg/dL (ref 8.6–10.4)
CO2: 27 mmol/L (ref 20–31)
Chloride: 104 mmol/L (ref 98–110)
Creat: 0.88 mg/dL (ref 0.50–0.99)
Glucose, Bld: 82 mg/dL (ref 65–99)
POTASSIUM: 4.2 mmol/L (ref 3.5–5.3)
Sodium: 139 mmol/L (ref 135–146)
TOTAL PROTEIN: 7.7 g/dL (ref 6.1–8.1)
Total Bilirubin: 0.3 mg/dL (ref 0.2–1.2)

## 2015-03-07 LAB — LIPID PANEL
CHOLESTEROL: 210 mg/dL — AB (ref 125–200)
HDL: 86 mg/dL (ref >=46)
LDL Cholesterol: 104 mg/dL (ref <130)
TRIGLYCERIDES: 101 mg/dL (ref <150)
Total CHOL/HDL Ratio: 2.4 Ratio (ref <=5.0)
VLDL: 20 mg/dL (ref <30)

## 2015-03-07 LAB — HEPATITIS C ANTIBODY: HCV Ab: NEGATIVE

## 2015-03-07 MED ORDER — OXYBUTYNIN CHLORIDE ER 5 MG PO TB24: 5.0000 mg | ORAL_TABLET | Freq: Every day | ORAL | Status: DC

## 2015-03-07 MED ORDER — LOSARTAN POTASSIUM 100 MG PO TABS: 100.0000 mg | ORAL_TABLET | Freq: Every day | ORAL | Status: DC

## 2015-03-07 NOTE — Progress Notes (Signed)
Subjective:   Patient ID: Deanna Haley, female     DOB: 20-Jan-1946, 69 y.o.    MRN: [id number]  PCP: Jeena Arnett, PA-C (new today, previously followed by Dr. Leward Quan, who retired 10/2014)  Chief Complaint  Patient presents with  . Hypertension  . Hyperlipidemia    HPI  Presents for evaluation of hyperlipidemia and HTN.   She has been taking her medications with no problems. Blood pressure is well-controlled on medications. She does not monitor her BP at home. She states that she eats well. No chest pain or shortness of breath. No headaches, fatigue, or weakness.   She had one episode of blurry vision in February which comes and goes. She plans to get her vision checked next year.   Patient also has concerns about wearing diapers. She states that when she laughs or "gets excited" she just can't hold it and has to run to the bathroom. She also describes urinary urge incontinence without the stressors. Has to wear a diaper every day. No abdominal or back pain. No fever or chills. No dysuria. She does have hot flashes. The urinary incontinence has been going on for about 2 years. When she does go to the bathroom, she is able to have a steady stream. No fecal incontinence.    Prior to Admission medications   Medication Sig Start Date End Date Taking? Authorizing Provider  aspirin 81 MG tablet Take 81 mg by mouth daily.     Yes Historical Provider, MD  atorvastatin (LIPITOR) 10 MG tablet Take 1 tablet (10 mg total) by mouth daily. 04/28/14  Yes Barton Fanny, MD  calcium-vitamin D (OSCAL WITH D) 500-200 MG-UNIT per tablet Take 1 tablet by mouth daily.     Yes Historical Provider, MD  gabapentin (NEURONTIN) 300 MG capsule Take 2 capsules at bedtime. 04/28/14  Yes Barton Fanny, MD  ibuprofen (ADVIL,MOTRIN) 100 MG tablet Take 100 mg by mouth every 6 (six) hours as needed.     Yes Historical Provider, MD  losartan-hydrochlorothiazide (HYZAAR) 50-12.5 MG per tablet Take 1  tablet by mouth daily. 04/28/14  Yes Barton Fanny, MD  vitamin E 100 UNIT capsule Take 100 Units by mouth daily.   Yes Historical Provider, MD  nabumetone (RELAFEN) 500 MG tablet Take 1 tablet (500 mg total) by mouth daily. Take with food. Patient not taking: Reported on 03/07/2015 10/27/14   Barton Fanny, MD     No Known Allergies   Patient Active Problem List   Diagnosis Date Noted  . Family history of premature coronary artery disease 07/05/2014  . Essential hypertension 04/15/2012  . Hyperlipidemia with target LDL less than 100 04/15/2012  . Deafness 04/15/2012     Family History  Problem Relation Age of Onset  . Diabetes Sister   . Heart attack Sister   . Diabetes Brother   . Heart attack Mother   . Heart attack Father   . Anemia Neg Hx   . Heart attack Sister      Social History   Social History  . Marital Status: Married    Spouse Name: N/A  . Number of Children: N/A  . Years of Education: N/A   Occupational History  . Not on file.   Social History Main Topics  . Smoking status: Never Smoker   . Smokeless tobacco: Never Used  . Alcohol Use: 0.6 oz/week    1 Glasses of wine per week  . Drug Use: No  . Sexual Activity: Not  on file   Other Topics Concern  . Not on file   Social History Narrative        Review of Systems Constitutional: Negative for fever, chills, fatigue and unexpected weight change.  Respiratory: Negative for chest tightness.  Gastrointestinal: Negative for nausea, vomiting, abdominal pain and diarrhea.  Genitourinary: Positive for urgency. Negative for dysuria, flank pain and difficulty urinating.  Neurological: Negative for weakness.  Psychiatric/Behavioral: Negative       Objective:  Physical Exam  Constitutional: She is oriented to person, place, and time. Vital signs are normal. She appears well-developed and well-nourished. She is active and cooperative. No distress.  BP 132/80 mmHg  Pulse 76  Temp(Src)  98.1 F (36.7 C) (Oral)  Resp 16  Ht 5\' 1"  (1.549 m)  Wt 170 lb (77.111 kg)  BMI 32.14 kg/m2  SpO2 97% She is deaf, and communicates via sign language and translator.  HENT:  Head: Normocephalic and atraumatic.  Right Ear: Hearing normal.  Left Ear: Hearing normal.  Eyes: Conjunctivae are normal. No scleral icterus.  Neck: Normal range of motion. Neck supple. No thyromegaly present.  Cardiovascular: Normal rate, regular rhythm and normal heart sounds.   Pulses:      Radial pulses are 2+ on the right side, and 2+ on the left side.  Pulmonary/Chest: Effort normal and breath sounds normal.  Lymphadenopathy:       Head (right side): No tonsillar, no preauricular, no posterior auricular and no occipital adenopathy present.       Head (left side): No tonsillar, no preauricular, no posterior auricular and no occipital adenopathy present.    She has no cervical adenopathy.       Right: No supraclavicular adenopathy present.       Left: No supraclavicular adenopathy present.  Neurological: She is alert and oriented to person, place, and time. No sensory deficit.  Skin: Skin is warm, dry and intact. No rash noted. No cyanosis or erythema. Nails show no clubbing.  Psychiatric: She has a normal mood and affect. Her behavior is normal. Her speech is not rapid and/or pressured, not delayed, not tangential and not slurred. She is communicative.             Assessment & Plan:  1. Essential hypertension Controlled. Due to urinary symptoms, D/C Hyzaar and start Cozaar at higher dose. - Comprehensive metabolic panel - losartan (COZAAR) 100 MG tablet; Take 1 tablet (100 mg total) by mouth daily.  Dispense: 90 tablet; Refill: 3  2. Hyperlipidemia with target LDL less than 100 Await lab results. - Lipid panel  3. Need for prophylactic vaccination and inoculation against influenza - Flu Vaccine QUAD 36+ mos IM  4. Postmenopausal - DG Bone Density; Future  5. Need for hepatitis C  screening test - Hepatitis C antibody  6. Mixed stress and urge urinary incontinence Stop diuretic. Trial of oxybutynin. Discussed that this may help the urgency symptoms, but not the stress symptoms. She is not currently interested in pelvic floor PT and will see what benefit she gets from the medication. - oxybutynin (DITROPAN-XL) 5 MG 24 hr tablet; Take 1 tablet (5 mg total) by mouth at bedtime.  Dispense: 90 tablet; Refill: 3  Return in about 6 months (around 09/04/2015) for re-evaluation of blood pressure and cholesterol.  Fara Chute, PA-C Physician Assistant-Certified [facility]

## 2015-03-07 NOTE — Progress Notes (Signed)
   Subjective:    Patient ID: Deanna Haley, female    DOB: 27-Oct-1945, 69 y.o.   MRN: [id number]  Chief Complaint  Patient presents with  . Hypertension  . Hyperlipidemia    HPI  Patient presents for evaluation of hyperlipidemia. She has been taking her medications with no problems.Blood pressure is well-controlled on medications. She does not monitor her BP at home. She states that she eats well. No chest pain or shortness of breath. No headaches, fatigue, or weakness. She had one episode of blurry vision in February which comes and goes. She plans to get her vision checked next year.   Patient also has concerns about wearing diapers. She states that when she laughs or "gets excited" she just can't hold it and has to run to the bathroom. She has urinary leakage and has to wear a diaper every day. No abdominal or back pain. No fever or chills. No dysuria. She does have hot flashes. The urinary incontinence has been going on for about 2 years. When she does go to the bathroom, she is able to have a steady stream. No fecal incontinence.   Review of Systems  Constitutional: Negative for fever, chills, fatigue and unexpected weight change.  Respiratory: Negative for chest tightness.   Gastrointestinal: Negative for nausea, vomiting, abdominal pain and diarrhea.  Genitourinary: Positive for urgency. Negative for dysuria, flank pain and difficulty urinating.  Neurological: Negative for weakness.  Psychiatric/Behavioral: Negative.        Objective:   Physical Exam  Constitutional: She is oriented to person, place, and time. She appears well-developed and well-nourished.  HENT:  Head: Normocephalic and atraumatic.  Neck: Normal range of motion. Neck supple. No thyromegaly present.  Cardiovascular: Normal rate and regular rhythm.   Pulmonary/Chest: Effort normal and breath sounds normal.  Abdominal: Soft. Bowel sounds are normal. She exhibits no distension and no mass. There is no  tenderness. There is no rebound and no guarding.  Neurological: She is alert and oriented to person, place, and time. She has normal reflexes.  Skin: Skin is warm and dry.  Psychiatric: She has a normal mood and affect. Her behavior is normal. Thought content normal.    BP 132/80 mmHg  Pulse 76  Temp(Src) 98.1 F (36.7 C) (Oral)  Resp 16  Ht 5\' 1"  (1.549 m)  Wt 170 lb (77.111 kg)  BMI 32.14 kg/m2  SpO2 97%       Assessment & Plan:  1. Essential hypertension Well controlled. See below about change from Hyzaar to Cozaar.  - Comprehensive metabolic panel - losartan (COZAAR) 100 MG tablet; Take 1 tablet (100 mg total) by mouth daily.  Dispense: 90 tablet; Refill: 3  2. Hyperlipidemia with target LDL less than 100 Awaiting lab results. Continue current treatment.  - Lipid panel  3. Need for prophylactic vaccination and inoculation against influenza - Flu Vaccine QUAD 36+ mos IM  4. Postmenopausal - DG Bone Density; Future  5. Need for hepatitis C screening test - Hepatitis C antibody  6. Mixed stress and urge urinary incontinence Discontinue Hyzaar as HCTZ component can be contributing to urinary symptoms. Change to Cozaar. Begin Oxybutynin for urinary symptoms. If symptoms continue, patient has option to begin Pelvic floor exercises with PT.  - oxybutynin (DITROPAN-XL) 5 MG 24 hr tablet; Take 1 tablet (5 mg total) by mouth at bedtime.  Dispense: 90 tablet; Refill: 3

## 2015-03-07 NOTE — Patient Instructions (Signed)
I will contact you with your lab results as soon as they are available.   If you have not heard from me in 2 weeks, please contact me.  The fastest way to get your results is to register for My Chart (see the instructions on the last page of this printout).  Please STOP the Hyzaar (losartan-HCTZ). The HCTZ component may be making the urination problem worse. START the Cozaar (losartan) alone, but it's a higher dose.  Try the Oxybutynin to help with the urination. It can cause dry mouth and constipation, and sometimes dizziness. If that happens, and you want to stop the medication, you may do that. If you decide that you'd like to try the pelvic floor physical therapy, let me know!

## 2015-03-08 ENCOUNTER — Encounter: Payer: Self-pay | Admitting: Physician Assistant

## 2015-03-09 ENCOUNTER — Ambulatory Visit: Payer: Medicare Other | Admitting: Physician Assistant

## 2015-03-09 ENCOUNTER — Other Ambulatory Visit: Payer: Self-pay

## 2015-03-09 DIAGNOSIS — E2839 Other primary ovarian failure: Secondary | ICD-10-CM

## 2015-04-03 ENCOUNTER — Ambulatory Visit
Admission: RE | Admit: 2015-04-03 | Discharge: 2015-04-03 | Disposition: A | Discharge status: Home / Self Care | Payer: Medicare Other | Source: Ambulatory Visit | Attending: Physician Assistant | Admitting: Physician Assistant

## 2015-04-03 DIAGNOSIS — E2839 Other primary ovarian failure: Secondary | ICD-10-CM

## 2015-04-11 ENCOUNTER — Encounter: Payer: Self-pay | Admitting: Physician Assistant

## 2015-04-21 ENCOUNTER — Encounter: Payer: Self-pay | Admitting: Physician Assistant

## 2015-04-21 DIAGNOSIS — M8580 Other specified disorders of bone density and structure, unspecified site: Secondary | ICD-10-CM | POA: Insufficient documentation

## 2015-04-27 ENCOUNTER — Telehealth: Payer: Self-pay | Admitting: Family Medicine

## 2015-04-27 ENCOUNTER — Other Ambulatory Visit: Payer: Self-pay | Admitting: Family Medicine

## 2015-04-27 MED ORDER — ATORVASTATIN CALCIUM 10 MG PO TABS: 10.0000 mg | ORAL_TABLET | Freq: Every day | ORAL | Status: DC

## 2015-04-27 NOTE — Telephone Encounter (Signed)
Left a message for patient to return call. She is at risk for med adhearance with atorvastatin, and we need to find out if there is anything we can do to help her with this situation.

## 2015-04-27 NOTE — Progress Notes (Signed)
Medication adherence report shows patient in need of refill of atorvastatin. Refill done. She has follow up scheduled.

## 2015-04-28 ENCOUNTER — Telehealth: Payer: Self-pay | Admitting: Family Medicine

## 2015-04-28 NOTE — Telephone Encounter (Signed)
Spoke with patient and she was out of town in May in [place] so she filled her prescription in June as soon as she got back into town.  Other than that she has filled her prescription of Atorvastatin on time like she is suppose to every 90 days.

## 2015-06-20 ENCOUNTER — Telehealth: Payer: Self-pay | Admitting: *Deleted

## 2015-06-20 ENCOUNTER — Other Ambulatory Visit: Payer: Self-pay

## 2015-06-20 NOTE — Telephone Encounter (Signed)
Pharmacy requesting refilll for atorvastatin.  rx for #90 with 1 refill sent 04/27/15

## 2015-06-30 ENCOUNTER — Other Ambulatory Visit: Payer: Self-pay | Admitting: Physician Assistant

## 2015-07-06 ENCOUNTER — Ambulatory Visit (INDEPENDENT_AMBULATORY_CARE_PROVIDER_SITE_OTHER): Payer: Medicare Other | Admitting: Cardiology

## 2015-07-06 VITALS — BP 120/84 | HR 90 | Ht 61.0 in | Wt 177.7 lb

## 2015-07-06 DIAGNOSIS — I10 Essential (primary) hypertension: Secondary | ICD-10-CM | POA: Diagnosis not present

## 2015-07-06 DIAGNOSIS — E669 Obesity, unspecified: Secondary | ICD-10-CM

## 2015-07-06 DIAGNOSIS — E785 Hyperlipidemia, unspecified: Secondary | ICD-10-CM

## 2015-07-06 DIAGNOSIS — Z8249 Family history of ischemic heart disease and other diseases of the circulatory system: Secondary | ICD-10-CM | POA: Diagnosis not present

## 2015-07-06 DIAGNOSIS — E66811 Obesity, class 1: Secondary | ICD-10-CM

## 2015-07-06 NOTE — Patient Instructions (Addendum)
NO CHANGE IN CURRENT MEDICATIONS  Your physician encouraged you to lose weight for better health.    Your physician wants you to follow-up in  [place]. SIGN LANGUAGE You will receive a reminder letter in the mail two months in advance. If you don't receive a letter, please call our office to schedule the follow-up appointment.    If you need a refill on your cardiac medications before your next appointment, please call your pharmacy.

## 2015-07-06 NOTE — Progress Notes (Signed)
PCP: JEFFERY,CHELLE, PA-C  Clinic Note: Chief Complaint  Patient presents with  . Annual Exam    HTN//pt states no Sx.//pt wants to know if she is too old to work, she is currently working part time and would like to know if its a good idea.  . Hypertension    HPI: Deanna Haley is a 70 y.o. female with a PMH below who presents today for annual follow-up of cardiac risk factors including hypertension and dyslipidemia. She had chest discomfort evaluated with an echocardiogram and Myoview stress test in 2012 that were both negative. At that time she was being seen by Dr. Tery Sanfilippo. She has a significant family history for CAD. 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 She was doing well at that time.  Recent Hospitalizations: None  Studies Reviewed: None  Interval History: Obtained using contracted sign language interpreter. Deanna Haley is doing well today. She has no complaints. The only issue is that she has been "eating too much "as a result she has gained 20 pounds. Because of this she's been a little bit more short of breath with exertion than usual. My last saw her she was doing some part-time housecleaning, didn't comment much of this today, but is not noted any chest discomfort or dyspnea with routine activity. When she is working hard she does note low but dyspnea now that she's gained weight.  No chest pain  with rest or exertion.  No PND, orthopnea or edema. No palpitations, lightheadedness, dizziness, weakness or syncope/near syncope. No TIA/amaurosis fugax symptoms. No claudication.  ROS: A comprehensive was performed. Review of Systems  Constitutional: Negative for malaise/fatigue.  HENT: Negative for nosebleeds.   Respiratory: Negative for cough and shortness of breath.   Cardiovascular:       Negative per history of present illness  Gastrointestinal: Negative for  constipation and blood in stool.  Genitourinary: Negative for hematuria.  Musculoskeletal: Positive for joint pain (arthritis pains).  Neurological: Negative for headaches.  Psychiatric/Behavioral: Negative.     Past Medical History  Diagnosis Date  . Arthritis   . Deaf   . Essential hypertension 04/15/2012    Evaluated at [facility]- Aug 2010 with Dr. Tery Sanfilippo   . Hyperlipidemia with target LDL less than 100 04/15/2012    Past Surgical History  Procedure Laterality Date  . Abdominal hysterectomy      1994  . Colonoscopy    . Polypectomy    . Mandible surgery  1986    per patient jaws lockup  . Doppler echocardiography  08/30/2010    left ventricle is normal,EF= >55%  . Nm myocar perf wall motion  08/29/2010    POST STRESS- EF 77% NO SIGNIFICANT WALL MOTION ABNORMALITIES,EXERCISE CAPCITY 7 METS ,LOW RISK SCAN   Prior to Admission medications   Medication Sig Start Date End Date Taking? Authorizing Provider  aspirin 81 MG tablet Take 81 mg by mouth daily.     Yes Historical Provider, MD  atorvastatin (LIPITOR) 10 MG tablet Take 1 tablet (10 mg total) by mouth daily. 04/27/15  Yes Elby Beck, FNP  calcium-vitamin D (OSCAL WITH D) 500-200 MG-UNIT per tablet Take 1 tablet by mouth daily.     Yes Historical Provider, MD  gabapentin (NEURONTIN) 300 MG capsule Take 2 capsules at bedtime. 04/28/14  Yes Barton Fanny, MD  ibuprofen (ADVIL,MOTRIN) 100 MG tablet Take 100 mg by  mouth every 6 (six) hours as needed.     Yes Historical Provider, MD  losartan (COZAAR) 100 MG tablet Take 1 tablet (100 mg total) by mouth daily. 03/07/15  Yes Chelle Jeffery, PA-C  vitamin E 100 UNIT capsule Take 100 Units by mouth daily.   Yes Historical Provider, MD   No Known Allergies   Social History   Social History  . Marital Status: Married    Spouse Name: N/A  . Number of Children: N/A  . Years of Education: N/A   Social History Main Topics  . Smoking  status: Never Smoker   . Smokeless tobacco: Never Used  . Alcohol Use: 0.6 oz/week    1 Glasses of wine per week  . Drug Use: No  . Sexual Activity: Not Asked   Other Topics Concern  . None   Social History Narrative   Family History  Problem Relation Age of Onset  . Diabetes Sister   . Heart attack Sister   . Diabetes Brother   . Heart attack Mother   . Heart attack Father   . Anemia Neg Hx   . Heart attack Sister     Wt Readings from Last 3 Encounters:  07/06/15 177 lb 11.2 oz (80.604 kg)  03/07/15 170 lb (77.111 kg)  10/27/14 159 lb 9.6 oz (72.394 kg)    PHYSICAL EXAM BP 120/84 mmHg  Pulse 90  Ht 5\' 1"  (1.549 m)  Wt 177 lb 11.2 oz (80.604 kg)  BMI 33.59 kg/m2 General appearance: alert, cooperative, appears stated age, no distress and Mildly obese; death - communicates using sign language interpreter HEENT: [place]/AT, EOMI, MMM, anicteric sclerae Neck: no adenopathy, no carotid bruit and no JVD Lungs: clear to auscultation bilaterally, normal percussion bilaterally and non-labored Heart: regular rate and rhythm, S1, S2 normal, no murmur, click, rub or gallop; nondisplaced PMI Abdomen: soft, non-tender; bowel sounds normal; no masses, no organomegaly; no HJR; mild truncal obesity Extremities: extremities normal, atraumatic, no cyanosis, or edema Pulses: 2+ and symmetric;  Skin: mobility and turgor normal Neurologic: Mental status: Alert, oriented, thought content appropriate Cranial nerves: normal (II-XII grossly intact)   Adult ECG Report  Rate: 90 ;  Rhythm: normal sinus rhythm and Normal axis, intervals and durations.;   Narrative Interpretation: Normal, stable EKG  Other studies Reviewed: Additional studies/ records that were reviewed today include:  Recent Labs:   Lab Results  Component Value Date   CHOL 210* 03/07/2015   HDL 86 03/07/2015   LDLCALC 104 03/07/2015   LDLDIRECT 87 10/27/2014   TRIG 101 03/07/2015   CHOLHDL 2.4 03/07/2015    ASSESSMENT  / PLAN: Problem List Items Addressed This Visit    Obesity (BMI 30.0-34.9) (Chronic)    The patient understands the need to lose weight with diet and exercise. We have discussed specific strategies for this.      Hyperlipidemia with target LDL less than 100 (Chronic)    LDL pretty much at goal for her. Labs are monitored by PCP on Epic. She is on atorvastatin. She has had some cramps at the 20 mg dose, therefore was reduced to 10 mg. I recommended that she can try coenzyme Q 10.      Relevant Orders   EKG 12-Lead   Family history of premature coronary artery disease (Chronic)   Relevant Orders   EKG 12-Lead   Essential hypertension - Primary (Chronic)    Excellent control on current medications. No changes. Continue to stay active. Hopefully with weight  loss, we can maybe even reduce medications.      Relevant Orders   EKG 12-Lead      Current medicines are reviewed at length with the patient today. (+/- concerns) she asked about being too old to work.-- She is fine to work as long as she is able to do so. The following changes have been made: None Studies Ordered:   Orders Placed This Encounter  Procedures  . EKG 12-Lead    ROV - 1 yr (need Sign Language Interpreter     Saleena Tamas, Leonie Green, M.D., M.S. Interventional Cardiologist   Pager # [phone]

## 2015-07-08 ENCOUNTER — Encounter: Payer: Self-pay | Admitting: Cardiology

## 2015-07-08 DIAGNOSIS — E669 Obesity, unspecified: Secondary | ICD-10-CM | POA: Insufficient documentation

## 2015-07-08 NOTE — Assessment & Plan Note (Signed)
Excellent control on current medications. No changes. Continue to stay active. Hopefully with weight loss, we can maybe even reduce medications.

## 2015-07-08 NOTE — Assessment & Plan Note (Signed)
The patient understands the need to lose weight with diet and exercise. We have discussed specific strategies for this.  

## 2015-07-08 NOTE — Assessment & Plan Note (Signed)
LDL pretty much at goal for her. Labs are monitored by PCP on Epic. She is on atorvastatin. She has had some cramps at the 20 mg dose, therefore was reduced to 10 mg. I recommended that she can try coenzyme Q 10.

## 2015-07-10 ENCOUNTER — Telehealth: Payer: Self-pay

## 2015-07-10 DIAGNOSIS — I10 Essential (primary) hypertension: Secondary | ICD-10-CM

## 2015-07-10 MED ORDER — GABAPENTIN 300 MG PO CAPS: ORAL_CAPSULE | ORAL | Status: DC

## 2015-07-10 MED ORDER — ATORVASTATIN CALCIUM 10 MG PO TABS: 10.0000 mg | ORAL_TABLET | Freq: Every day | ORAL | Status: DC

## 2015-07-10 MED ORDER — LOSARTAN POTASSIUM 100 MG PO TABS: 100.0000 mg | ORAL_TABLET | Freq: Every day | ORAL | Status: DC

## 2015-07-10 NOTE — Telephone Encounter (Signed)
SHe has refills on these medications.

## 2015-07-10 NOTE — Telephone Encounter (Signed)
Spoke with pt, she states her pharmacy states she has no refills. I sent in Rx's to her pharmacy.

## 2015-07-10 NOTE — Telephone Encounter (Signed)
Pt would like refills on her losartan (COZAAR) 100 MG tablet [id number], atorvastatin (LIPITOR) 10 MG tablet [id number] and gabapentin (NEURONTIN) 300 MG capsule [id number]. Pharmacy: [facility] #[id number] - [place], [place] - [address]. They use a deaf interpreter. CB # [phone]

## 2015-07-10 NOTE — Telephone Encounter (Signed)
Pt would like refills on her losartan (COZAAR) 100 MG tablet [id number], atorvastatin (LIPITOR) 10 MG tablet [id number] and gabapentin (NEURONTIN) 300 MG capsule [id number]. Pharmacy:  [facility] #[id number] - [place], [place] - [address]. They use a deaf interpreter. CB #

## 2015-08-11 ENCOUNTER — Encounter: Payer: Self-pay | Admitting: Gastroenterology

## 2015-09-05 ENCOUNTER — Ambulatory Visit: Payer: Medicare Other | Admitting: Physician Assistant

## 2015-10-31 ENCOUNTER — Ambulatory Visit: Payer: Medicare Other | Admitting: Physician Assistant

## 2015-11-21 ENCOUNTER — Ambulatory Visit (INDEPENDENT_AMBULATORY_CARE_PROVIDER_SITE_OTHER): Payer: Medicare Other | Admitting: Physician Assistant

## 2015-11-21 ENCOUNTER — Encounter: Payer: Self-pay | Admitting: Physician Assistant

## 2015-11-21 VITALS — BP 137/80 | HR 72 | Temp 98.4°F | Resp 16 | Ht 61.5 in | Wt 168.8 lb

## 2015-11-21 DIAGNOSIS — I10 Essential (primary) hypertension: Secondary | ICD-10-CM

## 2015-11-21 DIAGNOSIS — N951 Menopausal and female climacteric states: Secondary | ICD-10-CM

## 2015-11-21 DIAGNOSIS — N3946 Mixed incontinence: Secondary | ICD-10-CM | POA: Insufficient documentation

## 2015-11-21 DIAGNOSIS — E785 Hyperlipidemia, unspecified: Secondary | ICD-10-CM

## 2015-11-21 LAB — COMPREHENSIVE METABOLIC PANEL
ALT: 15 U/L (ref 6–29)
AST: 20 U/L (ref 10–35)
Albumin: 4.1 g/dL (ref 3.6–5.1)
Alkaline Phosphatase: 93 U/L (ref 33–130)
BILIRUBIN TOTAL: 0.4 mg/dL (ref 0.2–1.2)
BUN: 12 mg/dL (ref 7–25)
CALCIUM: 10.1 mg/dL (ref 8.6–10.4)
CO2: 23 mmol/L (ref 20–31)
Chloride: 106 mmol/L (ref 98–110)
Creat: 0.78 mg/dL (ref 0.50–0.99)
GLUCOSE: 99 mg/dL (ref 65–99)
Potassium: 4.1 mmol/L (ref 3.5–5.3)
Sodium: 139 mmol/L (ref 135–146)
Total Protein: 7.3 g/dL (ref 6.1–8.1)

## 2015-11-21 LAB — LIPID PANEL
CHOL/HDL RATIO: 2.3 ratio (ref <=5.0)
CHOLESTEROL: 179 mg/dL (ref 125–200)
HDL: 79 mg/dL (ref >=46)
LDL CALC: 74 mg/dL (ref <130)
Triglycerides: 129 mg/dL (ref <150)
VLDL: 26 mg/dL (ref <30)

## 2015-11-21 NOTE — Patient Instructions (Signed)
Try a dab of peppermint oil behind your ears to help with hot flashes at night!    IF you received an x-ray today, you will receive an invoice from [facility]. Please contact [facility] at [phone] with questions or concerns regarding your invoice.   IF you received labwork today, you will receive an invoice from [employer]. Please contact [facility] at [phone] with questions or concerns regarding your invoice.   Our billing staff will not be able to assist you with questions regarding bills from these companies.  You will be contacted with the lab results as soon as they are available. The fastest way to get your results is to activate your My Chart account. Instructions are located on the last page of this paperwork. If you have not heard from [place] regarding the results in 2 weeks, please contact this office.

## 2015-11-21 NOTE — Progress Notes (Signed)
Patient ID: Deanna Haley, female    DOB: 1946-01-29, 70 y.o.   MRN: [id number]  PCP: Jhace Fennell, PA-C  Subjective:   Chief Complaint  Patient presents with  . Follow-up    FOR LAB WORK TO CHECK CHOLESTEROL    HPI Presents for evaluation of hyperlipidemia. A translator is present.  She feels well, tolerates her medications.  Reports hot flashes every night, just around the head and neck. Doesn't have to change the bed linens, but does throw off the blankets and turn on a fan. Wonders what options she has.    Review of Systems  Constitutional: Positive for diaphoresis. Negative for fever, chills, activity change, appetite change, fatigue and unexpected weight change.  HENT: Positive for hearing loss (patient is deaf).   Eyes: Negative.   Respiratory: Negative.   Cardiovascular: Negative.   Gastrointestinal: Negative.   Endocrine: Negative.   Genitourinary: Negative.   Musculoskeletal: Negative.   Skin: Negative.   Allergic/Immunologic: Negative.   Neurological: Negative.   Hematological: Negative.   Psychiatric/Behavioral: Negative.        Patient Active Problem List   Diagnosis Date Noted  . Mixed stress and urge urinary incontinence 11/21/2015  . Obesity (BMI 30.0-34.9) 07/08/2015  . Low bone mass 04/21/2015  . Family history of premature coronary artery disease 07/05/2014  . Essential hypertension 04/15/2012  . Hyperlipidemia with target LDL less than 100 04/15/2012  . Deafness 04/15/2012     Prior to Admission medications   Medication Sig Start Date End Date Taking? Authorizing Provider  aspirin 81 MG tablet Take 81 mg by mouth daily.     Yes Historical Provider, MD  atorvastatin (LIPITOR) 10 MG tablet Take 1 tablet (10 mg total) by mouth daily. 07/10/15  Yes Breklyn Fabrizio, PA-C  calcium-vitamin D (OSCAL WITH D) 500-200 MG-UNIT per tablet Take 1 tablet by mouth daily.     Yes Historical Provider, MD  gabapentin (NEURONTIN) 300 MG capsule Take 2  capsules at bedtime. 07/10/15  Yes Randee Upchurch, PA-C  ibuprofen (ADVIL,MOTRIN) 100 MG tablet Take 100 mg by mouth every 6 (six) hours as needed.     Yes Historical Provider, MD  losartan (COZAAR) 100 MG tablet Take 1 tablet (100 mg total) by mouth daily. 07/10/15  Yes Lura Falor, PA-C  vitamin E 100 UNIT capsule Take 100 Units by mouth daily.   Yes Historical Provider, MD     No Known Allergies     Objective:  Physical Exam  Constitutional: She is oriented to person, place, and time. She appears well-developed and well-nourished. She is active and cooperative. No distress.  BP 137/80 mmHg  Pulse 72  Temp(Src) 98.4 F (36.9 C) (Oral)  Resp 16  Ht 5' 1.5" (1.562 m)  Wt 168 lb 12.8 oz (76.567 kg)  BMI 31.38 kg/m2  SpO2 97%  HENT:  Head: Normocephalic and atraumatic.  Right Ear: Hearing normal.  Left Ear: Hearing normal.  Eyes: Conjunctivae are normal. No scleral icterus.  Neck: Normal range of motion. Neck supple. No thyromegaly present.  Cardiovascular: Normal rate, regular rhythm and normal heart sounds.   Pulses:      Radial pulses are 2+ on the right side, and 2+ on the left side.  Pulmonary/Chest: Effort normal and breath sounds normal.  Lymphadenopathy:       Head (right side): No tonsillar, no preauricular, no posterior auricular and no occipital adenopathy present.       Head (left side): No tonsillar, no preauricular, no posterior auricular  and no occipital adenopathy present.    She has no cervical adenopathy.       Right: No supraclavicular adenopathy present.       Left: No supraclavicular adenopathy present.  Neurological: She is alert and oriented to person, place, and time. No sensory deficit.  Skin: Skin is warm, dry and intact. No rash noted. No cyanosis or erythema. Nails show no clubbing.  Psychiatric: She has a normal mood and affect. Her speech is normal and behavior is normal.           Assessment & Plan:   1. Hyperlipidemia with target LDL less  than 100 Await lab results. Adjust statin dose if indicated. - Comprehensive metabolic panel - Lipid panel  2. Essential hypertension Controlled. Continue current regimen.  3. Perimenopausal vasomotor symptoms Not interested in estrogens or venlafaxine, black cohosh. Will try peppermint oil dabbed behind the ears PRN.    Return in about 6 months (around 05/23/2016) for re-evaluation of blood pressure and cholesterol.    Fara Chute, PA-C Physician Assistant-Certified [facility]

## 2015-11-26 ENCOUNTER — Encounter: Payer: Self-pay | Admitting: Physician Assistant

## 2015-12-30 ENCOUNTER — Other Ambulatory Visit: Payer: Self-pay

## 2015-12-30 DIAGNOSIS — I10 Essential (primary) hypertension: Secondary | ICD-10-CM

## 2015-12-30 MED ORDER — GABAPENTIN 300 MG PO CAPS: ORAL_CAPSULE | ORAL | Status: DC

## 2015-12-30 MED ORDER — LOSARTAN POTASSIUM 100 MG PO TABS: 100.0000 mg | ORAL_TABLET | Freq: Every day | ORAL | Status: DC

## 2015-12-30 MED ORDER — ATORVASTATIN CALCIUM 10 MG PO TABS: 10.0000 mg | ORAL_TABLET | Freq: Every day | ORAL | Status: DC

## 2015-12-30 NOTE — Telephone Encounter (Signed)
Atorvastatin 10mg , Losartan-HCTZ 50/12.5; Gabapentin 300mg 

## 2016-05-28 ENCOUNTER — Ambulatory Visit: Payer: Medicare Other | Admitting: Physician Assistant

## 2016-06-04 ENCOUNTER — Encounter: Payer: Self-pay | Admitting: Physician Assistant

## 2016-06-04 ENCOUNTER — Ambulatory Visit (INDEPENDENT_AMBULATORY_CARE_PROVIDER_SITE_OTHER): Payer: Medicare Other | Admitting: Physician Assistant

## 2016-06-04 VITALS — BP 126/88 | HR 77 | Temp 98.3°F | Resp 18 | Ht 61.5 in | Wt 176.0 lb

## 2016-06-04 DIAGNOSIS — Z8049 Family history of malignant neoplasm of other genital organs: Secondary | ICD-10-CM | POA: Diagnosis not present

## 2016-06-04 DIAGNOSIS — E785 Hyperlipidemia, unspecified: Secondary | ICD-10-CM

## 2016-06-04 DIAGNOSIS — I10 Essential (primary) hypertension: Secondary | ICD-10-CM

## 2016-06-04 DIAGNOSIS — Z23 Encounter for immunization: Secondary | ICD-10-CM | POA: Diagnosis not present

## 2016-06-04 DIAGNOSIS — N3946 Mixed incontinence: Secondary | ICD-10-CM

## 2016-06-04 LAB — POCT URINALYSIS DIP (MANUAL ENTRY)
BILIRUBIN UA: NEGATIVE
BILIRUBIN UA: NEGATIVE
GLUCOSE UA: NEGATIVE
Nitrite, UA: NEGATIVE
PH UA: 5.5
Protein Ur, POC: NEGATIVE
RBC UA: NEGATIVE
SPEC GRAV UA: 1.01
Urobilinogen, UA: 0.2

## 2016-06-04 NOTE — Patient Instructions (Signed)
     IF you received an x-ray today, you will receive an invoice from [facility]. Please contact [facility] at [phone] with questions or concerns regarding your invoice.   IF you received labwork today, you will receive an invoice from [facility]. Please contact [facility] at [phone] with questions or concerns regarding your invoice.   Our billing staff will not be able to assist you with questions regarding bills from these companies.  You will be contacted with the lab results as soon as they are available. The fastest way to get your results is to activate your My Chart account. Instructions are located on the last page of this paperwork. If you have not heard from us regarding the results in 2 weeks, please contact this office.      

## 2016-06-04 NOTE — Progress Notes (Signed)
Patient ID: Deanna Haley, female    DOB: 12-13-1945, 71 y.o.   MRN: [id number]  PCP: Harrison Mons, PA-C  Chief Complaint  Patient presents with  . Follow-up    BLOOD PRESSURE    Subjective:   Presents for evaluation of HTN.  Sign language interpreter is present and interprets for this visit.  In general, she feels well. Tolerating her medications without difficulty.  No CP, SOB, HA, dizziness. No GI or GU complaints.  Foot pain with standing at work. Sees podiatry. Plans to retire in 6 months.  Possible cough on losartan. Non-productive. Doesn't feel ill.  2 brothers and a sister have been diagnosed with GU cancers (kidney, bladder and prostate). She has been advised to "get checked." She has some overactive bladder symptoms but these are not new. No hematuria.  Review of Systems As above.    Patient Active Problem List   Diagnosis Date Noted  . Mixed stress and urge urinary incontinence 11/21/2015  . Obesity (BMI 30.0-34.9) 07/08/2015  . Low bone mass 04/21/2015  . Family history of premature coronary artery disease 07/05/2014  . Essential hypertension 04/15/2012  . Hyperlipidemia with target LDL less than 100 04/15/2012  . Deafness 04/15/2012     Prior to Admission medications   Medication Sig Start Date End Date Taking? Authorizing Provider  aspirin 81 MG tablet Take 81 mg by mouth daily.     Yes Historical Provider, MD  atorvastatin (LIPITOR) 10 MG tablet Take 1 tablet (10 mg total) by mouth daily. 12/30/15  Yes Kiley Solimine, PA-C  calcium-vitamin D (OSCAL WITH D) 500-200 MG-UNIT per tablet Take 1 tablet by mouth daily.     Yes Historical Provider, MD  gabapentin (NEURONTIN) 300 MG capsule Take 2 capsules at bedtime. 12/30/15  Yes Dejah Droessler, PA-C  ibuprofen (ADVIL,MOTRIN) 100 MG tablet Take 100 mg by mouth every 6 (six) hours as needed.     Yes Historical Provider, MD  losartan (COZAAR) 100 MG tablet Take 1 tablet (100 mg total) by mouth daily.  12/30/15  Yes Adamaris King, PA-C     No Known Allergies     Objective:  Physical Exam  Constitutional: She is oriented to person, place, and time. She appears well-developed and well-nourished. She is active and cooperative. No distress.  BP 126/88 (BP Location: Left Arm, Patient Position: Sitting, Cuff Size: Large)   Pulse 77   Temp 98.3 F (36.8 C) (Oral)   Resp 18   Ht 5' 1.5" (1.562 m)   Wt 176 lb (79.8 kg)   SpO2 97%   BMI 32.72 kg/m   HENT:  Head: Normocephalic and atraumatic.  Right Ear: Hearing normal.  Left Ear: Hearing normal.  Eyes: Conjunctivae are normal. No scleral icterus.  Neck: Normal range of motion. Neck supple. No thyromegaly present.  Cardiovascular: Normal rate, regular rhythm and normal heart sounds.   Pulses:      Radial pulses are 2+ on the right side, and 2+ on the left side.  Pulmonary/Chest: Effort normal and breath sounds normal.  Lymphadenopathy:       Head (right side): No tonsillar, no preauricular, no posterior auricular and no occipital adenopathy present.       Head (left side): No tonsillar, no preauricular, no posterior auricular and no occipital adenopathy present.    She has no cervical adenopathy.       Right: No supraclavicular adenopathy present.       Left: No supraclavicular adenopathy present.  Neurological: She is  alert and oriented to person, place, and time. No sensory deficit.  Skin: Skin is warm, dry and intact. No rash noted. No cyanosis or erythema. Nails show no clubbing.  Psychiatric: She has a normal mood and affect. Her speech is normal and behavior is normal.   Wt Readings from Last 3 Encounters:  06/04/16 176 lb (79.8 kg)  11/21/15 168 lb 12.8 oz (76.6 kg)  07/06/15 177 lb 11.2 oz (80.6 kg)           Assessment & Plan:   1. Essential hypertension Controlled. Await labs. - Comprehensive metabolic panel - CBC with Differential/Platelet  2. Hyperlipidemia with target LDL less than 100 Await labs. Adjust  regimen as indicated by results. - Comprehensive metabolic panel - Lipid panel  3. Family history of cancer of genitourinary system - POCT urinalysis dipstick - Urine Microscopic - Ambulatory referral to Urology  4. Mixed stress and urge urinary incontinence Currently not bothersome.  5. Need for pneumococcal vaccination Series complete with today's dose. - Pneumococcal polysaccharide vaccine 23-valent greater than or equal to 2yo subcutaneous/IM - Care order/instruction:   Fara Chute, PA-C Physician Assistant-Certified [facility]

## 2016-06-05 LAB — CBC WITH DIFFERENTIAL/PLATELET
BASOS ABS: 0 10*3/uL (ref 0.0–0.2)
Basos: 0 %
EOS (ABSOLUTE): 0.3 10*3/uL (ref 0.0–0.4)
Eos: 3 %
Hematocrit: 37.9 % (ref 34.0–46.6)
Hemoglobin: 11.9 g/dL (ref 11.1–15.9)
IMMATURE GRANS (ABS): 0 10*3/uL (ref 0.0–0.1)
Immature Granulocytes: 0 %
LYMPHS ABS: 2.8 10*3/uL (ref 0.7–3.1)
LYMPHS: 34 %
MCH: 26.5 pg — AB (ref 26.6–33.0)
MCHC: 31.4 g/dL — AB (ref 31.5–35.7)
MCV: 84 fL (ref 79–97)
MONOCYTES: 5 %
Monocytes Absolute: 0.4 10*3/uL (ref 0.1–0.9)
NEUTROS ABS: 4.8 10*3/uL (ref 1.4–7.0)
Neutrophils: 58 %
PLATELETS: 283 10*3/uL (ref 150–379)
RBC: 4.49 x10E6/uL (ref 3.77–5.28)
RDW: 16.2 % — ABNORMAL HIGH (ref 12.3–15.4)
WBC: 8.3 10*3/uL (ref 3.4–10.8)

## 2016-06-05 LAB — LIPID PANEL
CHOL/HDL RATIO: 2.3 ratio (ref 0.0–4.4)
Cholesterol, Total: 233 mg/dL — ABNORMAL HIGH (ref 100–199)
HDL: 101 mg/dL (ref >39)
LDL CALC: 110 mg/dL — AB (ref 0–99)
TRIGLYCERIDES: 111 mg/dL (ref 0–149)
VLDL Cholesterol Cal: 22 mg/dL (ref 5–40)

## 2016-06-05 LAB — URINALYSIS, MICROSCOPIC ONLY

## 2016-06-05 LAB — COMPREHENSIVE METABOLIC PANEL
ALK PHOS: 114 IU/L (ref 39–117)
ALT: 14 IU/L (ref 0–32)
AST: 18 IU/L (ref 0–40)
Albumin/Globulin Ratio: 1.2 (ref 1.2–2.2)
Albumin: 4.3 g/dL (ref 3.6–4.8)
BUN/Creatinine Ratio: 20 (ref 12–28)
BUN: 18 mg/dL (ref 8–27)
Bilirubin Total: 0.3 mg/dL (ref 0.0–1.2)
CALCIUM: 10.1 mg/dL (ref 8.7–10.3)
CO2: 23 mmol/L (ref 18–29)
CREATININE: 0.91 mg/dL (ref 0.57–1.00)
Chloride: 103 mmol/L (ref 96–106)
GFR calc Af Amer: 74 mL/min/{1.73_m2} (ref >59)
GFR, EST NON AFRICAN AMERICAN: 65 mL/min/{1.73_m2} (ref >59)
Globulin, Total: 3.7 g/dL (ref 1.5–4.5)
Glucose: 88 mg/dL (ref 65–99)
Potassium: 4.1 mmol/L (ref 3.5–5.2)
SODIUM: 141 mmol/L (ref 134–144)
Total Protein: 8 g/dL (ref 6.0–8.5)

## 2016-06-08 ENCOUNTER — Encounter: Payer: Self-pay | Admitting: Physician Assistant

## 2016-07-08 ENCOUNTER — Ambulatory Visit (INDEPENDENT_AMBULATORY_CARE_PROVIDER_SITE_OTHER): Payer: Medicare Other

## 2016-07-08 ENCOUNTER — Ambulatory Visit (INDEPENDENT_AMBULATORY_CARE_PROVIDER_SITE_OTHER): Payer: Medicare Other | Admitting: Podiatry

## 2016-07-08 VITALS — BP 152/89 | HR 77 | Resp 16

## 2016-07-08 DIAGNOSIS — M7741 Metatarsalgia, right foot: Secondary | ICD-10-CM | POA: Diagnosis not present

## 2016-07-08 DIAGNOSIS — M7742 Metatarsalgia, left foot: Secondary | ICD-10-CM

## 2016-07-08 DIAGNOSIS — M19079 Primary osteoarthritis, unspecified ankle and foot: Secondary | ICD-10-CM

## 2016-07-08 DIAGNOSIS — M79674 Pain in right toe(s): Secondary | ICD-10-CM

## 2016-07-08 DIAGNOSIS — M2010 Hallux valgus (acquired), unspecified foot: Secondary | ICD-10-CM

## 2016-07-08 DIAGNOSIS — M779 Enthesopathy, unspecified: Secondary | ICD-10-CM | POA: Diagnosis not present

## 2016-07-08 DIAGNOSIS — M79675 Pain in left toe(s): Secondary | ICD-10-CM | POA: Diagnosis not present

## 2016-07-08 MED ORDER — MELOXICAM 15 MG PO TABS: 15.0000 mg | ORAL_TABLET | Freq: Every day | ORAL | 2 refills | Status: DC

## 2016-07-08 NOTE — Patient Instructions (Signed)
If was nice to meet you today. If you have any questions or any further concerns, please feel fee to give me a call. You can call our office at [phone] or please feel fee to send me a message through MyChart.   

## 2016-07-08 NOTE — Progress Notes (Signed)
   Subjective:    Patient ID: Deanna Haley, female    DOB: 08/14/1945, 71 y.o.   MRN: [id number]  HPI 71 year old female presents the also concerns of bilateral foot pain on the left side worse than right. She points along the bottom of the second third metatarsal heads where she is majority of pain. She states the areas painful with weightbearing or pressure. She denies any recent injury or trauma. She denies any numbness or tingling to the toes. She said no recent treatment for this. She does have a history of heel spur/plantar fasciitis that she does have custom inserts made that she has not been wearing. No other complaints at this time.   Review of Systems  All other systems reviewed and are negative.      Objective:   Physical Exam General: AAO x3, NAD  Dermatological: Skin is warm, dry and supple bilateral. Nails x 10 are well manicured; remaining integument appears unremarkable at this time. There are no open sores, no preulcerative lesions, no rash or signs of infection present.  Vascular: Dorsalis Pedis artery and Posterior Tibial artery pedal pulses are 2/4 bilateral with immedate capillary fill time. Pedal hair growth present. No varicosities and no lower extremity edema present bilateral. There is no pain with calf compression, swelling, warmth, erythema.   Neruologic: Grossly intact via light touch bilateral. Vibratory intact via tuning fork bilateral. Protective threshold with Semmes Wienstein monofilament intact to all pedal sites bilateral.   Musculoskeletal: There is tenderness to left foot and submetatarsal 2/3 on the second interspace. There is no palpable neuroma identified. There is trace edema to the second interspace the left foot. There is no area pinpoint bony tenderness or pain the vibratory sensation. There is pain of the right foot and a similar area however minimal. No other areas of tenderness are identified bilaterally. Muscular strength 5/5 in all groups  tested bilateral. HAV on the left>> right.   Gait: Unassisted, Nonantalgic.       Assessment & Plan:  71 year old female capsulitis/tendinitis left foot worse than right, HAV -Treatment options discussed including all alternatives, risks, and complications -Etiology of symptoms were discussed -X-rays were obtained and reviewed with the patient. No definitive evidence of acute fracture. 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 -Discussed a steroid injection to the left foot. Under sterile conditions a mixture of Kenalog/local anesthetic was infiltrated into the area of maximal tenderness of the left foot. Post injection care was discussed. -Prescribed mobic. Discussed side effects of the medication and directed to stop if any are to occur and call the office.  -Metatarsal offloading has dispensed. Discussed shoe gear changes. Recommended her to bring her orthotics er to the next appointment.  Celesta Gentile, DPM

## 2016-07-26 ENCOUNTER — Ambulatory Visit (INDEPENDENT_AMBULATORY_CARE_PROVIDER_SITE_OTHER): Payer: Medicare Other | Admitting: Cardiology

## 2016-07-26 ENCOUNTER — Encounter: Payer: Self-pay | Admitting: Cardiology

## 2016-07-26 VITALS — BP 168/82 | HR 59 | Ht 61.0 in | Wt 180.2 lb

## 2016-07-26 DIAGNOSIS — Z8249 Family history of ischemic heart disease and other diseases of the circulatory system: Secondary | ICD-10-CM | POA: Diagnosis not present

## 2016-07-26 DIAGNOSIS — E785 Hyperlipidemia, unspecified: Secondary | ICD-10-CM | POA: Diagnosis not present

## 2016-07-26 DIAGNOSIS — I10 Essential (primary) hypertension: Secondary | ICD-10-CM

## 2016-07-26 DIAGNOSIS — E669 Obesity, unspecified: Secondary | ICD-10-CM

## 2016-07-26 DIAGNOSIS — Z79899 Other long term (current) drug therapy: Secondary | ICD-10-CM | POA: Diagnosis not present

## 2016-07-26 MED ORDER — HYDROCHLOROTHIAZIDE 12.5 MG PO CAPS: 12.5000 mg | ORAL_CAPSULE | Freq: Every day | ORAL | 6 refills | Status: DC

## 2016-07-26 NOTE — Progress Notes (Signed)
PCP: Harrison Mons, PA-C  Clinic Note: Chief Complaint  Patient presents with  . Follow-up    1 year; Pt states no Sx.  - Cardiac CRFs & Fam Hx of CAD    HPI: Deanna Haley is a 71 y.o. female with a PMH below who presents today for Annual follow-up prior cardiac risk factors of hypertension and dyslipidemia. Previously evaluated for chest discomfort with an echocardiogram and Myoview in 2012 that were normal. Former patient of Dr. Tery Sanfilippo. She continues to follow with a cardiologist due to her significant family history of CAD.  History is provided with the assistance of a contracted sign language interpreter as she is deaf.Deanna Haley  Dashanay Pitkin was last seen on01/10/2015. -- She was doing well without any major complaints. Unfortunately she gained about 20 pounds.  Recent Hospitalizations: None  Studies Reviewed: None  Interval History: Deanna Haley is in her usual jovial state of mind doing well for her follow-up. She sheepishly acknowledges the fact that she has continued to put on weight and her blood pressures higher than she would like it to be. Thankfully, she is not having any symptoms whatsoever heart failure, PND orthopnea related to hypertension. No heart failure no anginal symptoms with rest or exertion.   Cardiac review of symptoms:  No chest pain or shortness of breath with rest or exertion.   No PND, orthopnea or edema.   No palpitations, lightheadedness, dizziness, weakness or syncope/near syncope.  No TIA/amaurosis fugax symptoms.  No claudication.  ROS: A comprehensive was performed. Review of Systems  Constitutional: Negative for malaise/fatigue.  HENT: Negative for nosebleeds.   Respiratory: Negative for shortness of breath.   Gastrointestinal: Negative for blood in stool and melena.  Genitourinary: Negative for hematuria.  Musculoskeletal: Positive for joint pain (stable arthritis). Negative for myalgias.  Neurological: Negative for dizziness.    All other systems reviewed and are negative.   Past Medical History:  Diagnosis Date  . Arthritis   . Deaf   . Essential hypertension 04/15/2012   Evaluated at [facility]- Aug 2010 with Dr. Tery Sanfilippo   . Hyperlipidemia with target LDL less than 100 04/15/2012    Past Surgical History:  Procedure Laterality Date  . ABDOMINAL HYSTERECTOMY     1994  . COLONOSCOPY    . DOPPLER ECHOCARDIOGRAPHY  08/30/2010   left ventricle is normal,EF= >55%  . MANDIBLE SURGERY  1986   per patient jaws lockup  . NM MYOCAR PERF WALL MOTION  08/29/2010   POST STRESS- EF 77% NO SIGNIFICANT WALL MOTION ABNORMALITIES,EXERCISE CAPCITY 7 METS ,LOW RISK SCAN  . POLYPECTOMY      Current Meds  Medication Sig  . aspirin 81 MG tablet Take 81 mg by mouth daily.    Deanna Haley atorvastatin (LIPITOR) 10 MG tablet Take 1 tablet (10 mg total) by mouth daily.  . calcium-vitamin D (OSCAL WITH D) 500-200 MG-UNIT per tablet Take 1 tablet by mouth daily.    Deanna Haley gabapentin (NEURONTIN) 300 MG capsule Take 2 capsules at bedtime.  Deanna Haley ibuprofen (ADVIL,MOTRIN) 100 MG tablet Take 100 mg by mouth every 6 (six) hours as needed.    Deanna Haley losartan (COZAAR) 100 MG tablet Take 1 tablet (100 mg total) by mouth daily.  . meloxicam (MOBIC) 15 MG tablet Take 1 tablet (15 mg total) by mouth daily.    No Known Allergies  Social History   Social History  . Marital status: Married    Spouse name: Deanna Haley  . Number  of children: 3  . Years of education: N/A   Occupational History  . custodian    Social History Main Topics  . Smoking status: Never Smoker  . Smokeless tobacco: Never Used  . Alcohol use 0.6 oz/week    1 Glasses of wine per week  . Drug use: No  . Sexual activity: Not Asked   Other Topics Concern  . None   Social History Narrative   Lives with her husband.   Their 3 adult children live locally.    family history includes Cancer in her brother, brother, and sister; Diabetes in her brother  and sister; Heart attack in her father, mother, and sister; Lupus in her sister; Stroke in her brother.  Wt Readings from Last 3 Encounters:  07/26/16 81.7 kg (180 lb 3.2 oz)  06/04/16 79.8 kg (176 lb)  11/21/15 76.6 kg (168 lb 12.8 oz)  January 2017 weight was 177 pounds (80.6 kg)  PHYSICAL EXAM BP (!) 168/82   Pulse (!) 59   Ht 5\' 1"  (1.549 m)   Wt 81.7 kg (180 lb 3.2 oz)   BMI 34.05 kg/m  -- BP was 150/80 after podiatry office. General appearance: alert, cooperative, appears stated age, no distress and mild-moderately obese. Well-nourished and well-groomed. Neck: no adenopathy, no carotid bruit and no JVD Lungs: clear to auscultation bilaterally, normal percussion bilaterally and non-labored Heart: regular rate and rhythm, S1, S2 normal, no murmur, click, rub or gallop; nondisplaced PMI Abdomen: soft, non-tender; bowel sounds normal; no masses, no organomegaly; no HJR; mild truncal obesity Extremities: extremities normal, atraumatic, no cyanosis, or edema Pulses: 2+ and symmetric;  Skin: mobility and turgor normal Neurologic: Mental status: Alert, oriented, thought content appropriate; very pleasant mood and affect. Happy and jovial    Adult ECG Report  Rate: 59 ;  Rhythm: normal sinus rhythm; normal axis, intervals and durations. Cannot exclude septal MI, age undetermined.  Narrative Interpretation: Stable EKG   Other studies Reviewed: Additional studies/ records that were reviewed today include:  Recent Labs:   Lab Results  Component Value Date   CHOL 233 (H) 06/04/2016   HDL 101 06/04/2016   LDLCALC 110 (H) 06/04/2016   LDLDIRECT 87 10/27/2014   TRIG 111 06/04/2016   CHOLHDL 2.3 06/04/2016   Lab Results  Component Value Date   CREATININE 0.91 06/04/2016   BUN 18 06/04/2016   NA 141 06/04/2016   K 4.1 06/04/2016   CL 103 06/04/2016   CO2 23 06/04/2016    ASSESSMENT / PLAN: Problem List Items Addressed This Visit    Essential hypertension - Primary  (Chronic)    She is now had 2 consecutive reviewed cording is of hypertensive pressures higher today than it was for podiatry office. Plan: Add HCTZ. Recheck labs today and then labs next week with a repeat blood pressure check. I hope was that she would start to lose weight and not have to be on a medication. I did encourage her to continue to try to lose weight so we can potentially come back off this medication.      Relevant Medications   hydrochlorothiazide (MICROZIDE) 12.5 MG capsule   Other Relevant Orders   EKG 12-Lead   Hyperlipidemia with target LDL less than 100 (Chronic)    LDL is not quite where we want to be. I think this could also be attributed to her weight gain. She is only on 10 mg atorvastatin. Low threshold to increase to 20 mg if the next set of  results don't show improvement.      Relevant Medications   hydrochlorothiazide (MICROZIDE) 12.5 MG capsule   Other Relevant Orders   EKG 12-Lead   Family history of premature coronary artery disease (Chronic)    No active cardiac symptoms. She's had a negative cardiac evaluation in the past -->  Plan: simply continue to treat cardiac risk factors.      Obesity (BMI 30.0-34.9) (Chronic)    The patient understands the need to lose weight with diet and exercise. We have discussed specific strategies for this.       Other Visit Diagnoses    Medication management       Relevant Orders   Basic metabolic panel   Basic metabolic panel   EKG [id number]      Current medicines are reviewed at length with the patient today. (+/- concerns) n/a The following changes have been made: n/a  Patient Instructions  MEDICATION CHANGE  START HYDRO CHLORTHIAZIDE ( [place]) 12.5 MG one tablet daily   Labs Need today  Please go t o [address] - first floor - [facility] TODAY  THEN NEXT Thursday - GO AGAIN TO THE LAB.   Your physician recommends that you schedule a follow-up appointment [place]  FOR BLOOD PRESSURE ONLY.    Your physician wants you to follow-up in:12 MONTHS WITH DR Hae Ahlers.- 30 MIN You will receive a reminder letter in the mail two months in advance. If you don't receive a letter, please call our office to schedule the follow-up appointment.   If you need a refill on your cardiac medications before your next appointment, please call your pharmacy.     Studies Ordered:   Orders Placed This Encounter  Procedures  . Basic metabolic panel  . Basic metabolic panel  . EKG 12-Lead      Glenetta Hew, M.D., M.S. Interventional Cardiologist   Pager # [phone] Phone # [phone] [address] [place], [place] [address]

## 2016-07-26 NOTE — Patient Instructions (Addendum)
MEDICATION CHANGE  START HYDRO CHLORTHIAZIDE ( HTCZ) 12.5 MG one tablet daily   Labs Need today  Please go t o [address] - first floor - [facility] TODAY  THEN NEXT Thursday - GO AGAIN TO THE LAB.   Your physician recommends that you schedule a follow-up appointment [place] FOR BLOOD PRESSURE ONLY.    Your physician wants you to follow-up in:12 MONTHS WITH DR HARDING.- 30 MIN You will receive a reminder letter in the mail two months in advance. If you don't receive a letter, please call our office to schedule the follow-up appointment.   If you need a refill on your cardiac medications before your next appointment, please call your pharmacy.

## 2016-07-28 ENCOUNTER — Encounter: Payer: Self-pay | Admitting: Cardiology

## 2016-07-28 NOTE — Assessment & Plan Note (Signed)
No active cardiac symptoms. She's had a negative cardiac evaluation in the past -->  Plan: simply continue to treat cardiac risk factors.

## 2016-07-28 NOTE — Assessment & Plan Note (Signed)
LDL is not quite where we want to be. I think this could also be attributed to her weight gain. She is only on 10 mg atorvastatin. Low threshold to increase to 20 mg if the next set of results don't show improvement.

## 2016-07-28 NOTE — Assessment & Plan Note (Signed)
She is now had 2 consecutive reviewed cording is of hypertensive pressures higher today than it was for podiatry office. Plan: Add HCTZ. Recheck labs today and then labs next week with a repeat blood pressure check. I hope was that she would start to lose weight and not have to be on a medication. I did encourage her to continue to try to lose weight so we can potentially come back off this medication.

## 2016-07-28 NOTE — Assessment & Plan Note (Signed)
The patient understands the need to lose weight with diet and exercise. We have discussed specific strategies for this.  

## 2016-08-05 ENCOUNTER — Ambulatory Visit: Payer: Medicare Other | Admitting: Podiatry

## 2016-08-05 LAB — BASIC METABOLIC PANEL
BUN / CREAT RATIO: 24 (ref 12–28)
BUN: 31 mg/dL — ABNORMAL HIGH (ref 8–27)
CHLORIDE: 101 mmol/L (ref 96–106)
CO2: 26 mmol/L (ref 18–29)
Calcium: 10.4 mg/dL — ABNORMAL HIGH (ref 8.7–10.3)
Creatinine, Ser: 1.28 mg/dL — ABNORMAL HIGH (ref 0.57–1.00)
GFR calc non Af Amer: 43 mL/min/{1.73_m2} — ABNORMAL LOW (ref >59)
GFR, EST AFRICAN AMERICAN: 49 mL/min/{1.73_m2} — AB (ref >59)
GLUCOSE: 85 mg/dL (ref 65–99)
POTASSIUM: 4.2 mmol/L (ref 3.5–5.2)
SODIUM: 136 mmol/L (ref 134–144)

## 2016-08-09 ENCOUNTER — Telehealth: Payer: Self-pay | Admitting: Cardiology

## 2016-08-09 NOTE — Telephone Encounter (Signed)
New Message    *STAT* If patient is at the pharmacy, call can be transferred to refill team.   1. Which medications need to be refilled? (please list name of each medication and dose if known) hydrochlorothiazide (MICROZIDE) 12.5 MG capsule  2. Which pharmacy/location (including street and city if local pharmacy) is medication to be sent to? [id number] - [place], [place] - [address] AT [place]  3. Do they need a 30 day or 90 day supply? 30 day

## 2016-08-09 NOTE — Telephone Encounter (Signed)
Called patient and spoke to her via interpreter-made aware prescription was sent on 1/26 with 6 refills.  Patient unable to find bottle of medication-advised to call [facility] and make them aware and have them refill medication.  Patient aware and will call [facility].  Advised to call with further questions or concerns.

## 2016-08-12 ENCOUNTER — Telehealth: Payer: Self-pay | Admitting: *Deleted

## 2016-08-12 DIAGNOSIS — R7989 Other specified abnormal findings of blood chemistry: Secondary | ICD-10-CM

## 2016-08-12 DIAGNOSIS — I10 Essential (primary) hypertension: Secondary | ICD-10-CM

## 2016-08-12 DIAGNOSIS — R799 Abnormal finding of blood chemistry, unspecified: Secondary | ICD-10-CM

## 2016-08-12 DIAGNOSIS — Z79899 Other long term (current) drug therapy: Secondary | ICD-10-CM

## 2016-08-12 NOTE — Telephone Encounter (Signed)
-----   Message from Leonie Man, MD sent at 08/08/2016  7:10 PM EST ----- This is the follow-up lab after starting HCTZ. It looks like she is starting to get a little bit dry. I think we need to stop HCTZ, encourage her to hydrate. Would then probably start low-dose amlodipine 2.5 mg for antihypertensive effect.  Recheck another chemistry panel in 2 weeks.  Glenetta Hew, MD

## 2016-08-12 NOTE — Telephone Encounter (Signed)
Left message to call back  

## 2016-08-14 LAB — BASIC METABOLIC PANEL
BUN/Creatinine Ratio: 27 (ref 12–28)
BUN: 34 mg/dL — ABNORMAL HIGH (ref 8–27)
CO2: 26 mmol/L (ref 18–29)
Calcium: 11.1 mg/dL — ABNORMAL HIGH (ref 8.7–10.3)
Chloride: 103 mmol/L (ref 96–106)
Creatinine, Ser: 1.25 mg/dL — ABNORMAL HIGH (ref 0.57–1.00)
GFR, EST AFRICAN AMERICAN: 51 mL/min/{1.73_m2} — AB (ref >59)
GFR, EST NON AFRICAN AMERICAN: 44 mL/min/{1.73_m2} — AB (ref >59)
Glucose: 96 mg/dL (ref 65–99)
Potassium: 4 mmol/L (ref 3.5–5.2)
SODIUM: 135 mmol/L (ref 134–144)

## 2016-08-16 ENCOUNTER — Encounter: Payer: Self-pay | Admitting: Podiatry

## 2016-08-16 ENCOUNTER — Ambulatory Visit (INDEPENDENT_AMBULATORY_CARE_PROVIDER_SITE_OTHER): Payer: Medicare Other | Admitting: Podiatry

## 2016-08-16 DIAGNOSIS — M7741 Metatarsalgia, right foot: Secondary | ICD-10-CM | POA: Diagnosis not present

## 2016-08-16 DIAGNOSIS — M7742 Metatarsalgia, left foot: Secondary | ICD-10-CM

## 2016-08-16 DIAGNOSIS — M722 Plantar fascial fibromatosis: Secondary | ICD-10-CM

## 2016-08-16 DIAGNOSIS — M779 Enthesopathy, unspecified: Secondary | ICD-10-CM

## 2016-08-16 MED ORDER — DICLOFENAC SODIUM 75 MG PO TBEC: 75.0000 mg | DELAYED_RELEASE_TABLET | Freq: Two times a day (BID) | ORAL | 1 refills | Status: DC

## 2016-08-19 DIAGNOSIS — N183 Chronic kidney disease, stage 3 unspecified: Secondary | ICD-10-CM | POA: Insufficient documentation

## 2016-08-19 DIAGNOSIS — Z79899 Other long term (current) drug therapy: Secondary | ICD-10-CM | POA: Insufficient documentation

## 2016-08-19 MED ORDER — AMLODIPINE BESYLATE 2.5 MG PO TABS: 2.5000 mg | ORAL_TABLET | Freq: Every day | ORAL | 6 refills | Status: DC

## 2016-08-19 NOTE — Telephone Encounter (Signed)
Spoke to patient through telephone interpreter. Labs result given . Patient verbalized understanding. Will stop HCTZ , start amlodipine 2.5.mg daily.    Labs in 2 weeks. E-SENT MEDIATION 30 DAY SUPPLY, LABSLIP TO [facility]  PL[facility] IN COMPUTER.

## 2016-08-19 NOTE — Progress Notes (Signed)
Subjective: 72 year old female presents the office they for follow-up evaluation of bilateral foot and left side worse than right. She states the injection did help in the anti-inflammatory was doing okay but she did not get much relief with anti-inflammatory. She does have a history of orthotics that she had several years ago but she has not been wearing them recently as they are not fitting but she debridement today for me to look at. She is inquiring about new ones today. She denies any recent injury. She denies he swelling or redness to her feet. Chest is has occasional she gets up in the arch of the foot but not a regular basis. Denies any systemic complaints such as fevers, chills, nausea, vomiting. No acute changes since last appointment, and no other complaints at this time.   Objective: AAO x3, NAD presents the family which interpreter.;  DP/PT pulses palpable bilaterally, CRT less than 3 seconds There is mild to palpation of the left foot on the second third interspaces most along the submetatarsal head as well. There is no palpable neuroma identified this time. There is no overlying edema, erythema, increase in warmth. There is no discomfort along the medial band plantar fascia to the foot however subjective she does get some discomfort at times. There is no pain along the insertion of the plantar fascia and the arch of the foot. There is no other areas of tenderness identified bilaterally. MMT 5/5. Upon evaluation of her orthotics they are worn out. She also has over-the-counter inserts which are not supported. Her old orthotics are also sulcus length putting pressure into the metatarsal heads.  No open lesions or pre-ulcerative lesions.  No pain with calf compression, swelling, warmth, erythema  Assessment: Bilateral capsulitis, metatarsalgia/plantar fasciitis  Plan: -All treatment options discussed with the patient including all alternatives, risks, complications.  -Prescribed voltaren.  Discussed side effects of the medication and directed to stop if any are to occur and call the office. We will try this and see how she doesn't post meloxicam. -Also discussed shoe gear modifications. -Discussed new orthotics and she wishes to proceed with these. She was scanned for orthotics were sent to [facility] labs. -RTC in 3 weeks to [facility] or sooner if needed. Encouraged to call any question, concerns or any change in symptoms.  Deanna Haley, DPM

## 2016-08-20 ENCOUNTER — Telehealth: Payer: Self-pay | Admitting: Cardiology

## 2016-08-20 NOTE — Telephone Encounter (Signed)
Returned call-spoke to husband via interpreter.  Wife is unavailable, she is at work.    Left message for patient to call back.  (No other # listed to contact patient)  Husband will let patient know to return call.

## 2016-08-20 NOTE — Telephone Encounter (Signed)
New Message    Pt c/o medication issue:  1. Name of Medication: atorvastatin (LIPITOR) 10 MG tablet  2. How are you currently taking this medication (dosage and times per day)? Pt does not know how to take the medication at all. She is completely confused. States it is for her kidneys.  3. Are you having a reaction (difficulty breathing--STAT)? No  4. What is your medication issue? Pt does not know how to take the medication at all. She is completely confused. States it is for her kidneys.

## 2016-08-22 NOTE — Telephone Encounter (Signed)
Spoke to patient via interpreter  patient wanted to know if she was suppose to swallow Amlodipine 2.5 mg or place under the tongue because the pill is so small. How much fluid to drink in a day. RN informed patient to swallow the pill , and drink 8-10  ,8 oz of water daily. Patient aware to got lab in 2 weeks.

## 2016-09-05 LAB — BASIC METABOLIC PANEL
BUN / CREAT RATIO: 22 (ref 12–28)
BUN: 24 mg/dL (ref 8–27)
CHLORIDE: 107 mmol/L — AB (ref 96–106)
CO2: 25 mmol/L (ref 18–29)
Calcium: 10.6 mg/dL — ABNORMAL HIGH (ref 8.7–10.3)
Creatinine, Ser: 1.07 mg/dL — ABNORMAL HIGH (ref 0.57–1.00)
GFR calc non Af Amer: 53 mL/min/{1.73_m2} — ABNORMAL LOW (ref >59)
GFR, EST AFRICAN AMERICAN: 61 mL/min/{1.73_m2} (ref >59)
Glucose: 97 mg/dL (ref 65–99)
POTASSIUM: 4.1 mmol/L (ref 3.5–5.2)
Sodium: 138 mmol/L (ref 134–144)

## 2016-09-12 ENCOUNTER — Ambulatory Visit: Payer: Medicare Other | Admitting: *Deleted

## 2016-09-12 ENCOUNTER — Encounter: Payer: Self-pay | Admitting: Podiatry

## 2016-09-12 DIAGNOSIS — M722 Plantar fascial fibromatosis: Secondary | ICD-10-CM

## 2016-09-12 NOTE — Progress Notes (Signed)
Patient ID: Deanna Haley, female   DOB: 04-09-1946, 71 y.o.   MRN: [id number]   Patient presents for orthotic pick up.  Verbal and written break in and wear instructions given.  Patient will follow up in 4 weeks if symptoms worsen or fail to improve.

## 2016-09-12 NOTE — Patient Instructions (Signed)

## 2016-09-13 ENCOUNTER — Telehealth: Payer: Self-pay | Admitting: *Deleted

## 2016-09-13 ENCOUNTER — Other Ambulatory Visit: Payer: Medicare Other

## 2016-09-13 NOTE — Telephone Encounter (Signed)
-----   Message from Leonie Man, MD sent at 09/12/2016 12:52 PM EDT ----- Labs look but the returning more towards baseline. Continue to encourage oral hydration. Hopefully the blood pressure will be fine with amlodipine  We should list HCTZ and chlorthalidone as allergies - adverse Rx.  Glenetta Hew, MD

## 2016-09-13 NOTE — Telephone Encounter (Signed)
Left a video- Voice message with interpreter assistance Calling to give results of labwork

## 2016-09-16 NOTE — Telephone Encounter (Signed)
New message    Pt returning call about results.

## 2016-09-16 NOTE — Telephone Encounter (Signed)
New Message     Pt calling back for results

## 2016-09-16 NOTE — Telephone Encounter (Signed)
LM VIA INTERPRETOR  FOR CALL BACK .Adonis Housekeeper

## 2016-09-17 NOTE — Telephone Encounter (Signed)
Received a call from interpretor lab results given.

## 2016-09-19 ENCOUNTER — Other Ambulatory Visit: Payer: Self-pay | Admitting: Physician Assistant

## 2016-09-20 NOTE — Telephone Encounter (Signed)
05/2016 last ov

## 2016-09-25 ENCOUNTER — Encounter: Payer: Self-pay | Admitting: Physician Assistant

## 2016-10-04 ENCOUNTER — Other Ambulatory Visit: Payer: Self-pay | Admitting: Urology

## 2016-10-04 DIAGNOSIS — N281 Cyst of kidney, acquired: Secondary | ICD-10-CM

## 2016-10-07 ENCOUNTER — Ambulatory Visit (HOSPITAL_COMMUNITY)
Admission: RE | Admit: 2016-10-07 | Discharge: 2016-10-07 | Disposition: A | Discharge status: Home / Self Care | Payer: Medicare Other | Source: Ambulatory Visit | Attending: Urology | Admitting: Urology

## 2016-10-07 DIAGNOSIS — K769 Liver disease, unspecified: Secondary | ICD-10-CM | POA: Insufficient documentation

## 2016-10-07 DIAGNOSIS — M5136 Other intervertebral disc degeneration, lumbar region: Secondary | ICD-10-CM | POA: Diagnosis not present

## 2016-10-07 DIAGNOSIS — M47816 Spondylosis without myelopathy or radiculopathy, lumbar region: Secondary | ICD-10-CM | POA: Diagnosis not present

## 2016-10-07 DIAGNOSIS — N281 Cyst of kidney, acquired: Secondary | ICD-10-CM | POA: Insufficient documentation

## 2016-10-07 MED ORDER — GADOBENATE DIMEGLUMINE 529 MG/ML IV SOLN
10.0000 mL | Freq: Once | INTRAVENOUS | Status: AC | PRN
  Administered 2016-10-07: 9 mL via INTRAVENOUS

## 2016-10-09 ENCOUNTER — Encounter (HOSPITAL_COMMUNITY): Payer: Self-pay | Admitting: Emergency Medicine

## 2016-10-09 ENCOUNTER — Ambulatory Visit (HOSPITAL_COMMUNITY)
Admission: EM | Admit: 2016-10-09 | Discharge: 2016-10-09 | Disposition: A | Discharge status: Home / Self Care | Payer: Medicare Other | Attending: Family Medicine | Admitting: Family Medicine

## 2016-10-09 DIAGNOSIS — M791 Myalgia, unspecified site: Secondary | ICD-10-CM

## 2016-10-09 DIAGNOSIS — M79604 Pain in right leg: Secondary | ICD-10-CM

## 2016-10-09 MED ORDER — NAPROXEN 250 MG PO TABS: 250.0000 mg | ORAL_TABLET | Freq: Two times a day (BID) | ORAL | 0 refills | Status: DC

## 2016-10-09 NOTE — ED Notes (Signed)
Waiting for interpreter on video service

## 2016-10-09 NOTE — Discharge Instructions (Signed)
For now apply warm compresses over the muscle whenever you get a chance. This may help increase circulation and comfort. He may also take the Naprosyn pain medicine as directed. Recommend limiting walking and prolonged standing as much as possible. This will continue to make the pain in the leg worse. There does not seem to be a problem with circulation, nerves, bones. This appears to be overuse of a particular muscle which is causing pain.

## 2016-10-09 NOTE — ED Provider Notes (Signed)
CSN: [id number]     Arrival date & time 10/09/16  1001 History   First MD Initiated Contact with Patient 10/09/16 1048     Chief Complaint  Patient presents with  . Leg Pain   (Consider location/radiation/quality/duration/timing/severity/associated sxs/prior Treatment) 71 year old female with chronic right leg pain. She states it is worse when working, ambulation. No known injury. She states she has been on her feet, walking and prolonged standing for several years and these activities increase the pain. Pain is located lateral to the tibia. It involves the soft tissue/muscle. She has seen a PCP in the past was now retired and was placed on gabapentin. This did not help. She has an appointment with her PCP for her leg in June 2018.  Interpretation with services for the deaf via video was used. Opportunity to ask questions were provided.       Past Medical History:  Diagnosis Date  . Arthritis   . Deaf   . Essential hypertension 04/15/2012   Evaluated at [place]- Aug 2010 with Dr. Tery Sanfilippo   . Hyperlipidemia with target LDL less than 100 04/15/2012   Past Surgical History:  Procedure Laterality Date  . ABDOMINAL HYSTERECTOMY     1994  . COLONOSCOPY    . DOPPLER ECHOCARDIOGRAPHY  08/30/2010   left ventricle is normal,EF= >55%  . MANDIBLE SURGERY  1986   per patient jaws lockup  . NM MYOCAR PERF WALL MOTION  08/29/2010   POST STRESS- EF 77% NO SIGNIFICANT WALL MOTION ABNORMALITIES,EXERCISE CAPCITY 7 METS ,LOW RISK SCAN  . POLYPECTOMY     Family History  Problem Relation Age of Onset  . Diabetes Sister   . Cancer Sister     kidney  . Lupus Sister   . Cancer Brother     kidney, bladder, prostate  . Heart attack Mother   . Heart attack Father   . Diabetes Brother   . Stroke Brother   . Cancer Brother     kidney, bladder and prostate cancer  . Heart attack Sister   . Anemia Neg Hx    Social History  Substance Use Topics  . Smoking  status: Never Smoker  . Smokeless tobacco: Never Used  . Alcohol use 0.6 oz/week    1 Glasses of wine per week   OB History    No data available     Review of Systems  Constitutional: Negative for activity change, chills and fever.  HENT: Negative.   Respiratory: Negative.   Cardiovascular: Negative.   Musculoskeletal: Positive for myalgias.       As per HPI  Skin: Negative for color change, pallor and rash.  Neurological: Negative.   All other systems reviewed and are negative.   Allergies  Chlorthalidone and Hctz [hydrochlorothiazide]  Home Medications   Prior to Admission medications   Medication Sig Start Date End Date Taking? Authorizing Provider  amLODipine (NORVASC) 2.5 MG tablet Take 1 tablet (2.5 mg total) by mouth daily. 08/19/16 11/17/16  Leonie Man, MD  aspirin 81 MG tablet Take 81 mg by mouth daily.      Historical Provider, MD  atorvastatin (LIPITOR) 10 MG tablet Take 1 tablet (10 mg total) by mouth daily. 12/30/15   Chelle Jeffery, PA-C  calcium-vitamin D (OSCAL WITH D) 500-200 MG-UNIT per tablet Take 1 tablet by mouth daily.      Historical Provider, MD  diclofenac (VOLTAREN) 75 MG EC tablet Take 1 tablet (75 mg total) by mouth  2 (two) times daily. 08/16/16   Trula Slade, DPM  gabapentin (NEURONTIN) 300 MG capsule TAKE 2 CAPSULES AT BEDTIME. 09/21/16   Chelle Jeffery, PA-C  ibuprofen (ADVIL,MOTRIN) 100 MG tablet Take 100 mg by mouth every 6 (six) hours as needed.      Historical Provider, MD  losartan (COZAAR) 100 MG tablet Take 1 tablet (100 mg total) by mouth daily. 12/30/15   Chelle Jeffery, PA-C  meloxicam (MOBIC) 15 MG tablet Take 1 tablet (15 mg total) by mouth daily. 07/08/16 07/08/17  Trula Slade, DPM  naproxen (NAPROSYN) 250 MG tablet Take 1 tablet (250 mg total) by mouth 2 (two) times daily with a meal. 10/09/16   Janne Napoleon, NP   Meds Ordered and Administered this Visit  Medications - No data to display  BP (!) 148/79 (BP Location: Right Arm)    Pulse 72   Temp 98 F (36.7 C) (Oral)   Resp 20   SpO2 100%  No data found.   Physical Exam  Constitutional: She is oriented to person, place, and time. She appears well-developed and well-nourished. No distress.  HENT:  Head: Normocephalic and atraumatic.  Eyes: EOM are normal.  Neck: Normal range of motion.  Pulmonary/Chest: Effort normal.  Musculoskeletal: Normal range of motion. She exhibits no edema or deformity.  Examination of the right leg reveals no swelling, discoloration, deformity. Palpation of the knee reveals no tenderness. She has full range of motion of the knee. The tenderness is along the tibialis anterior muscle. No bony tenderness. No erythema or swelling. Palpation produces mild tenderness. Full range of motion of the ankle and foot is intact.  Lymphadenopathy:    She has no cervical adenopathy.  Neurological: She is alert and oriented to person, place, and time. No cranial nerve deficit.  Skin: Skin is warm and dry.  Psychiatric: She has a normal mood and affect.  Nursing note and vitals reviewed.   Urgent Care Course     Procedures (including critical care time)  Labs Review Labs Reviewed - No data to display  Imaging Review No results found.   Visual Acuity Review  Right Eye Distance:   Left Eye Distance:   Bilateral Distance:    Right Eye Near:   Left Eye Near:    Bilateral Near:         MDM   1. Right leg pain   2. Muscle pain    For now apply warm compresses over the muscle whenever you get a chance. This may help increase circulation and comfort. He may also take the Naprosyn pain medicine as directed. Recommend limiting walking and prolonged standing as much as possible. This will continue to make the pain in the leg worse. There does not seem to be a problem with circulation, nerves, bones. This appears to be overuse of a particular muscle which is causing pain.     Janne Napoleon, NP 10/09/16 1122

## 2016-10-10 ENCOUNTER — Other Ambulatory Visit: Payer: Medicare Other

## 2016-10-12 ENCOUNTER — Other Ambulatory Visit: Payer: Self-pay | Admitting: Physician Assistant

## 2016-10-12 DIAGNOSIS — I10 Essential (primary) hypertension: Secondary | ICD-10-CM

## 2016-10-14 NOTE — Telephone Encounter (Signed)
Meds ordered this encounter  Medications  . losartan (COZAAR) 100 MG tablet    Sig: TAKE 1 TABLET (100 MG TOTAL) BY MOUTH DAILY.    Dispense:  90 tablet    Refill:  0

## 2016-10-24 ENCOUNTER — Encounter: Payer: Self-pay | Admitting: Podiatry

## 2016-10-24 ENCOUNTER — Ambulatory Visit (INDEPENDENT_AMBULATORY_CARE_PROVIDER_SITE_OTHER): Payer: Medicare Other | Admitting: Podiatry

## 2016-10-24 ENCOUNTER — Other Ambulatory Visit (INDEPENDENT_AMBULATORY_CARE_PROVIDER_SITE_OTHER): Payer: Medicare Other

## 2016-10-24 DIAGNOSIS — M2042 Other hammer toe(s) (acquired), left foot: Secondary | ICD-10-CM | POA: Diagnosis not present

## 2016-10-24 DIAGNOSIS — M7742 Metatarsalgia, left foot: Secondary | ICD-10-CM | POA: Diagnosis not present

## 2016-10-24 DIAGNOSIS — M779 Enthesopathy, unspecified: Secondary | ICD-10-CM

## 2016-10-24 DIAGNOSIS — M7741 Metatarsalgia, right foot: Secondary | ICD-10-CM

## 2016-10-24 DIAGNOSIS — M2041 Other hammer toe(s) (acquired), right foot: Secondary | ICD-10-CM

## 2016-10-24 DIAGNOSIS — M2010 Hallux valgus (acquired), unspecified foot: Secondary | ICD-10-CM

## 2016-10-24 MED ORDER — NONFORMULARY OR COMPOUNDED ITEM
0 refills | Status: DC
Start: 2016-10-24 — End: 2016-12-13

## 2016-10-24 NOTE — Progress Notes (Signed)
Subjective: 71 year old female presents the office today for follow-up evaluation of her cane orthotics. She states the orthotics are not fitting correctly not helping her foot. She has not been wearing them on the left side she has tried only small amount time on the right side. Otherwise she states that she is doing the same she has no other concerns today. Denies any systemic complaints such as fevers, chills, nausea, vomiting. No acute changes since last appointment, and no other complaints at this time.   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 The prominent metatarsal heads plantarly with atrophy of the fat pad. Mild diffuse tenderness submetatarsal 1-500 feet there is no specific area pinpoint bony tenderness or pain vibratory sensation. No palpable neuroma. No swelling. No erythema or warmth. No open lesions or pre-ulcerative lesions.  No pain with calf compression, swelling, warmth, erythema  Assessment: Bilateral foot pain likely due to metatarsalgia, bunion/hammertoes  Plan: -All treatment options discussed with the patient including all alternatives, risks, complications.  -She does not have her orthotics with her. I encouraged her to make an appointment to bring her inserts [facility] four-week and send him back to have the modified to make it more comfortable for her. She has a states that the antibiotic to is not been helping. We'll try topical anti-inflammatories as ordered today through [employer].  -Discussed shoegear changes.  -Reappoint at her convienence.  -Patient encouraged to call the office with any questions, concerns, change in symptoms.   Celesta Gentile, DPM

## 2016-11-22 ENCOUNTER — Other Ambulatory Visit: Payer: Self-pay | Admitting: Physician Assistant

## 2016-12-03 ENCOUNTER — Ambulatory Visit: Payer: Medicare Other | Admitting: Physician Assistant

## 2016-12-13 ENCOUNTER — Encounter: Payer: Self-pay | Admitting: Physician Assistant

## 2016-12-13 ENCOUNTER — Other Ambulatory Visit: Payer: Self-pay | Admitting: Physician Assistant

## 2016-12-13 ENCOUNTER — Ambulatory Visit (INDEPENDENT_AMBULATORY_CARE_PROVIDER_SITE_OTHER): Payer: Medicare Other | Admitting: Physician Assistant

## 2016-12-13 VITALS — BP 133/87 | HR 79 | Temp 98.0°F | Resp 18 | Ht 61.0 in | Wt 164.0 lb

## 2016-12-13 DIAGNOSIS — I10 Essential (primary) hypertension: Secondary | ICD-10-CM | POA: Diagnosis not present

## 2016-12-13 DIAGNOSIS — E669 Obesity, unspecified: Secondary | ICD-10-CM | POA: Diagnosis not present

## 2016-12-13 DIAGNOSIS — E785 Hyperlipidemia, unspecified: Secondary | ICD-10-CM | POA: Diagnosis not present

## 2016-12-13 DIAGNOSIS — M79604 Pain in right leg: Secondary | ICD-10-CM

## 2016-12-13 DIAGNOSIS — Z79899 Other long term (current) drug therapy: Secondary | ICD-10-CM | POA: Diagnosis not present

## 2016-12-13 DIAGNOSIS — N183 Chronic kidney disease, stage 3 unspecified: Secondary | ICD-10-CM

## 2016-12-13 DIAGNOSIS — Z1231 Encounter for screening mammogram for malignant neoplasm of breast: Secondary | ICD-10-CM

## 2016-12-13 LAB — POCT URINALYSIS DIP (MANUAL ENTRY)
Bilirubin, UA: NEGATIVE
Blood, UA: NEGATIVE
Glucose, UA: NEGATIVE mg/dL
Ketones, POC UA: NEGATIVE mg/dL
Nitrite, UA: NEGATIVE
Protein Ur, POC: NEGATIVE mg/dL
Spec Grav, UA: 1.015 (ref 1.010–1.025)
UROBILINOGEN UA: 0.2 U/dL
pH, UA: 6 (ref 5.0–8.0)

## 2016-12-13 MED ORDER — TIZANIDINE HCL 2 MG PO CAPS: 2.0000 mg | ORAL_CAPSULE | Freq: Three times a day (TID) | ORAL | 0 refills | Status: DC | PRN

## 2016-12-13 NOTE — Assessment & Plan Note (Signed)
Controlled, but desire maximal lowering without adverse effects due to family history. Spoke with pharmacy. The losartan is on file there, unclear why she was told she needed authorization. Resume losartan. Continue amlodipine. If develops lightheadedness, would stop one agent.

## 2016-12-13 NOTE — Progress Notes (Signed)
Patient ID: Deanna Haley, female    DOB: 08/20/1945, 71 y.o.   MRN: [id number]  PCP: Harrison Mons, PA-C  Chief Complaint  Patient presents with  . Hypertension  . Follow-up    Subjective:   Presents for evaluation of HTN, hyperlipidemia.  Sign language interpreter is present for translation.  I last saw her in 05/2016. In the interim, she saw her cardiologist, Dr. Ellyn Hack, who added HCTZ to her regimen of losartan. Unfortunately, she became dehydrated and had a decline in renal function. The HCTZ was stopped and amlodipine started in it's place. She was encouraged to lose weight, which would help her HTN and cholesterol. Given her family history of CAD, maximizing risk reduction is key.  She relates that she has run out of losartan, but isn't able to tell me how long ago. She says that she was given a 3-day supply at the pharmacy and has been waiting for me to authorize refills. Based on the record, I prescribed a 90-day supply on 10/14/16, so she shouldn't be out.   Has lost weight with healthier eating and increased exercise. She feels really good, except for persistent pain in the anterior RIGHT leg with walking and standing. This problem developed a number of years ago. Gabapentin initially helped, but then wore off after a couple of years. She tried naproxen, meloxicam, robaxin, diclofenac gel, [facility] Anti-Inflammatory Cream- Diclofenac 3%, Baclofen 2%, Lidocaine 2%, all with little or no benefit. NSAIDS are no longer an option for her, due to renal dysfunction. The pain resolves with rest. She describes it as a throbbing, like a headache.  Review of Systems  Constitutional: Negative.   HENT: Negative for sore throat.   Eyes: Negative for visual disturbance.  Respiratory: Negative for cough, chest tightness, shortness of breath and wheezing.   Cardiovascular: Negative for chest pain and palpitations.  Gastrointestinal: Negative for abdominal pain, diarrhea,  nausea and vomiting.  Genitourinary: Negative for dysuria, frequency, hematuria and urgency.  Musculoskeletal: Negative for arthralgias and myalgias.  Skin: Negative for rash.  Neurological: Negative for dizziness, weakness and headaches.  Psychiatric/Behavioral: Negative for decreased concentration. The patient is not nervous/anxious.        Patient Active Problem List   Diagnosis Date Noted  . Medication management 08/19/2016  . CKD (chronic kidney disease) stage 3, GFR 30-59 ml/min 08/19/2016  . Family history of cancer of genitourinary system 06/04/2016  . Mixed stress and urge urinary incontinence 11/21/2015  . Obesity (BMI 30.0-34.9) 07/08/2015  . Low bone mass 04/21/2015  . Family history of premature coronary artery disease 07/05/2014  . Essential hypertension 04/15/2012  . Hyperlipidemia with target LDL less than 100 04/15/2012  . Deafness 04/15/2012     Prior to Admission medications   Medication Sig Start Date End Date Taking? Authorizing Provider  aspirin 81 MG tablet Take 81 mg by mouth daily.     Yes [provider]  atorvastatin (LIPITOR) 10 MG tablet TAKE 1 TABLET (10 MG TOTAL) BY MOUTH DAILY. 11/22/16  Yes Mali Eppard, PA-C  calcium-vitamin D (OSCAL WITH D) 500-200 MG-UNIT per tablet Take 1 tablet by mouth daily.     Yes [provider]  gabapentin (NEURONTIN) 300 MG capsule TAKE 2 CAPSULES AT BEDTIME. 09/21/16  Yes Toria Monte, PA-C  ibuprofen (ADVIL,MOTRIN) 100 MG tablet Take 100 mg by mouth every 6 (six) hours as needed.     Yes [provider]  losartan (COZAAR) 100 MG tablet TAKE 1 TABLET (100  MG TOTAL) BY MOUTH DAILY. 10/14/16  Yes Harrison Mons, PA-C  NONFORMULARY OR COMPOUNDED [facility]  Anti-Inflammatory Cream- Diclofenac 3%, Baclofen 2%, Lidocaine 2% Apply 1-2 grams to affected area 3-4 times daily Qty. 120 gm 3 refills 10/24/16  Yes Trula Slade, DPM  amLODipine (NORVASC) 2.5 MG tablet Take 1  tablet (2.5 mg total) by mouth daily. 08/19/16 11/17/16  Leonie Man, MD  diclofenac (VOLTAREN) 75 MG EC tablet Take 1 tablet (75 mg total) by mouth 2 (two) times daily. Patient not taking: Reported on 12/13/2016 08/16/16   Trula Slade, DPM  meloxicam (MOBIC) 15 MG tablet Take 1 tablet (15 mg total) by mouth daily. Patient not taking: Reported on 12/13/2016 07/08/16 07/08/17  Trula Slade, DPM  naproxen (NAPROSYN) 250 MG tablet Take 1 tablet (250 mg total) by mouth 2 (two) times daily with a meal. Patient not taking: Reported on 12/13/2016 10/09/16   Janne Napoleon, NP     Allergies  Allergen Reactions  . Chlorthalidone     dehydration  . Hctz [Hydrochlorothiazide] Other (See Comments)    Rise in Cr, Ca & BUN -- dehydration       Objective:  Physical Exam  Constitutional: She is oriented to person, place, and time. She appears well-developed and well-nourished. She is active and cooperative. No distress.  BP 133/87 (BP Location: Right Arm, Patient Position: Sitting, Cuff Size: Large)   Pulse 79   Temp 98 F (36.7 C) (Oral)   Resp 18   Ht 5\' 1"  (1.549 m)   Wt 164 lb (74.4 kg)   SpO2 97%   BMI 30.99 kg/m   HENT:  Head: Normocephalic and atraumatic.  Right Ear: Hearing normal.  Left Ear: Hearing normal.  Eyes: Conjunctivae are normal. No scleral icterus.  Neck: Normal range of motion. Neck supple. No thyromegaly present.  Cardiovascular: Normal rate, regular rhythm and normal heart sounds.   Pulses:      Radial pulses are 2+ on the right side, and 2+ on the left side.       Dorsalis pedis pulses are 2+ on the right side, and 2+ on the left side.       Posterior tibial pulses are 2+ on the right side, and 2+ on the left side.  Pulmonary/Chest: Effort normal and breath sounds normal.  Musculoskeletal:       Right knee: Normal.       Right ankle: Normal. Achilles tendon normal.       Right lower leg: She exhibits no bony tenderness, no swelling, no edema, no deformity and  no laceration. Tenderness: milld tenderness of the anterior tibialis.       Right foot: Normal.  Lymphadenopathy:       Head (right side): No tonsillar, no preauricular, no posterior auricular and no occipital adenopathy present.       Head (left side): No tonsillar, no preauricular, no posterior auricular and no occipital adenopathy present.    She has no cervical adenopathy.       Right: No supraclavicular adenopathy present.       Left: No supraclavicular adenopathy present.  Neurological: She is alert and oriented to person, place, and time. No sensory deficit.  Skin: Skin is warm, dry and intact. No rash noted. No cyanosis or erythema. Nails show no clubbing.  Psychiatric: She has a normal mood and affect. Her speech is normal and behavior is normal.    Wt Readings from Last 3 Encounters:  12/13/16  164 lb (74.4 kg)  07/26/16 180 lb 3.2 oz (81.7 kg)  06/04/16 176 lb (79.8 kg)       Assessment & Plan:   Problem List Items Addressed This Visit    Essential hypertension - Primary (Chronic)    Controlled, but desire maximal lowering without adverse effects due to family history. Spoke with pharmacy. The losartan is on file there, unclear why she was told she needed authorization. Resume losartan. Continue amlodipine. If develops lightheadedness, would stop one agent.      Relevant Orders   POCT urinalysis dipstick (Completed)   TSH   T4, free   Comprehensive metabolic panel   CBC with Differential/Platelet   Hyperlipidemia with target LDL less than 100 (Chronic)    Update labs today. Low threshold to increase statin dose, due to family history of CAD. However, she has lost 16 lbs since January through healthy eating and exercise, so may be at goal on current regimen.      Relevant Orders   Lipid panel   Obesity (BMI 30.0-34.9) (Chronic)    Congratulated on the successful implementation of healthier lifestyles. Encouraged continued healthy eating and regular exercise.       Medication management    Medications reviewed and list updated. Await lab results.      CKD (chronic kidney disease) stage 3, GFR 30-59 ml/min    No longer on NSAIDS or diuretic. Update labs today.      Relevant Orders   POCT urinalysis dipstick (Completed)   TSH   T4, free   Comprehensive metabolic panel   CBC with Differential/Platelet    Other Visit Diagnoses    Leg pain, anterior, right       Trial of tizanidine. If no improvement, plan radiographs, +/- referral to ortho vs PT vs other.   Relevant Medications   tizanidine (ZANAFLEX) 2 MG capsule      Return in about 6 months (around 06/14/2017) for re-evaluation of blood pressure.   Fara Chute, PA-C [facility]

## 2016-12-13 NOTE — Patient Instructions (Addendum)
We recommend that you schedule a mammogram for breast cancer screening. Typically, you do not need a referral to do this. Please contact a local imaging center to schedule your mammogram.  [facility] - [phone]  *ask for the Radiology Department [facility] - [phone] or [phone]  [facility] - [phone] [place] [phone] [facility] - [phone]  *ask for the [facility] - [phone]  *ask for the Radiology Department [facility] - [phone]  *ask for the [place] - [phone]   IF you received an x-ray today, you will receive an invoice from [facility]. Please contact [facility] at [phone] with questions or concerns regarding your invoice.   IF you received labwork today, you will receive an invoice from [place]. Please contact [facility] at [phone] with questions or concerns regarding your invoice.   Our billing staff will not be able to assist you with questions regarding bills from these companies.  You will be contacted with the lab results as soon as they are available. The fastest way to get your results is to activate your My Chart account. Instructions are located on the last page of this paperwork. If you have not heard from [place] regarding the results in 2 weeks, please contact this office.    If the tizanidine isn't helpful for the leg pain, let me know.

## 2016-12-13 NOTE — Assessment & Plan Note (Signed)
No longer on NSAIDS or diuretic. Update labs today.

## 2016-12-13 NOTE — Assessment & Plan Note (Signed)
Congratulated on the successful implementation of healthier lifestyles. Encouraged continued healthy eating and regular exercise.

## 2016-12-13 NOTE — Assessment & Plan Note (Signed)
Medications reviewed and list updated. Await lab results.

## 2016-12-13 NOTE — Assessment & Plan Note (Signed)
Update labs today. Low threshold to increase statin dose, due to family history of CAD. However, she has lost 16 lbs since January through healthy eating and exercise, so may be at goal on current regimen.

## 2016-12-14 LAB — COMPREHENSIVE METABOLIC PANEL WITH GFR
ALT: 17 IU/L (ref 0–32)
AST: 20 IU/L (ref 0–40)
Albumin/Globulin Ratio: 1.2 (ref 1.2–2.2)
Albumin: 4.4 g/dL (ref 3.5–4.8)
Alkaline Phosphatase: 119 IU/L — ABNORMAL HIGH (ref 39–117)
BUN/Creatinine Ratio: 18 (ref 12–28)
BUN: 17 mg/dL (ref 8–27)
Bilirubin Total: 0.3 mg/dL (ref 0.0–1.2)
CO2: 22 mmol/L (ref 20–29)
Calcium: 10.7 mg/dL — ABNORMAL HIGH (ref 8.7–10.3)
Chloride: 101 mmol/L (ref 96–106)
Creatinine, Ser: 0.92 mg/dL (ref 0.57–1.00)
GFR calc Af Amer: 73 mL/min/1.73 (ref >59)
GFR calc non Af Amer: 63 mL/min/1.73 (ref >59)
Globulin, Total: 3.6 g/dL (ref 1.5–4.5)
Glucose: 89 mg/dL (ref 65–99)
Potassium: 3.8 mmol/L (ref 3.5–5.2)
Sodium: 139 mmol/L (ref 134–144)
Total Protein: 8 g/dL (ref 6.0–8.5)

## 2016-12-14 LAB — TSH: TSH: 1.23 u[IU]/mL (ref 0.450–4.500)

## 2016-12-14 LAB — LIPID PANEL
Chol/HDL Ratio: 2.2 ratio (ref 0.0–4.4)
Cholesterol, Total: 211 mg/dL — ABNORMAL HIGH (ref 100–199)
HDL: 96 mg/dL (ref >39)
LDL Calculated: 98 mg/dL (ref 0–99)
Triglycerides: 87 mg/dL (ref 0–149)
VLDL Cholesterol Cal: 17 mg/dL (ref 5–40)

## 2016-12-14 LAB — CBC WITH DIFFERENTIAL/PLATELET
Basophils Absolute: 0 x10E3/uL (ref 0.0–0.2)
Basos: 0 %
EOS (ABSOLUTE): 0.2 x10E3/uL (ref 0.0–0.4)
Eos: 2 %
Hematocrit: 37.7 % (ref 34.0–46.6)
Hemoglobin: 12.2 g/dL (ref 11.1–15.9)
Immature Grans (Abs): 0 x10E3/uL (ref 0.0–0.1)
Immature Granulocytes: 0 %
Lymphocytes Absolute: 3.3 x10E3/uL — ABNORMAL HIGH (ref 0.7–3.1)
Lymphs: 37 %
MCH: 27.8 pg (ref 26.6–33.0)
MCHC: 32.4 g/dL (ref 31.5–35.7)
MCV: 86 fL (ref 79–97)
Monocytes Absolute: 0.5 x10E3/uL (ref 0.1–0.9)
Monocytes: 5 %
Neutrophils Absolute: 4.9 x10E3/uL (ref 1.4–7.0)
Neutrophils: 56 %
Platelets: 288 x10E3/uL (ref 150–379)
RBC: 4.39 x10E6/uL (ref 3.77–5.28)
RDW: 15.8 % — ABNORMAL HIGH (ref 12.3–15.4)
WBC: 8.9 x10E3/uL (ref 3.4–10.8)

## 2016-12-14 LAB — T4, FREE: FREE T4: 1.4 ng/dL (ref 0.82–1.77)

## 2016-12-29 ENCOUNTER — Other Ambulatory Visit: Payer: Self-pay | Admitting: Physician Assistant

## 2016-12-29 DIAGNOSIS — M79604 Pain in right leg: Secondary | ICD-10-CM

## 2016-12-29 DIAGNOSIS — I10 Essential (primary) hypertension: Secondary | ICD-10-CM

## 2016-12-31 ENCOUNTER — Ambulatory Visit
Admission: RE | Admit: 2016-12-31 | Discharge: 2016-12-31 | Disposition: A | Discharge status: Home / Self Care | Payer: Medicare Other | Source: Ambulatory Visit | Attending: Physician Assistant | Admitting: Physician Assistant

## 2016-12-31 DIAGNOSIS — Z1231 Encounter for screening mammogram for malignant neoplasm of breast: Secondary | ICD-10-CM

## 2017-01-06 ENCOUNTER — Encounter: Payer: Self-pay | Admitting: Physician Assistant

## 2017-01-20 ENCOUNTER — Other Ambulatory Visit: Payer: Self-pay | Admitting: *Deleted

## 2017-01-20 MED ORDER — AMLODIPINE BESYLATE 2.5 MG PO TABS: 2.5000 mg | ORAL_TABLET | Freq: Every day | ORAL | 6 refills | Status: DC

## 2017-02-23 ENCOUNTER — Encounter (HOSPITAL_COMMUNITY): Payer: Self-pay | Admitting: Physician Assistant

## 2017-02-23 ENCOUNTER — Ambulatory Visit (HOSPITAL_COMMUNITY)
Admission: EM | Admit: 2017-02-23 | Discharge: 2017-02-23 | Disposition: A | Discharge status: Home / Self Care | Payer: Medicare Other | Attending: Internal Medicine | Admitting: Internal Medicine

## 2017-02-23 DIAGNOSIS — R42 Dizziness and giddiness: Secondary | ICD-10-CM

## 2017-02-23 DIAGNOSIS — R197 Diarrhea, unspecified: Secondary | ICD-10-CM

## 2017-02-23 LAB — POCT I-STAT, CHEM 8
BUN: 25 mg/dL — ABNORMAL HIGH (ref 6–20)
CREATININE: 1.1 mg/dL — AB (ref 0.44–1.00)
Calcium, Ion: 1.45 mmol/L — ABNORMAL HIGH (ref 1.15–1.40)
Chloride: 109 mmol/L (ref 101–111)
GLUCOSE: 105 mg/dL — AB (ref 65–99)
HCT: 38 % (ref 36.0–46.0)
HEMOGLOBIN: 12.9 g/dL (ref 12.0–15.0)
POTASSIUM: 4 mmol/L (ref 3.5–5.1)
Sodium: 142 mmol/L (ref 135–145)
TCO2: 24 mmol/L (ref 22–32)

## 2017-02-23 NOTE — ED Triage Notes (Signed)
Patient triaged by provider using ASL video interpreter.

## 2017-02-23 NOTE — Discharge Instructions (Signed)
Your dizziness can be cause by dehydration due to the diarrhea. Keep hydrated, you urine should be clear to pale yellow in color. Follow bland diet during diarrhea. Take some probiotics after diarrhea resolves. Culture will be sent once stool sample is collected, results can take 3-5 days to come back, you will be contacted with any positive results. Any additional treatment needed will be called in then. Contact PCP tomorrow to schedule appointment for follow up in the next 3-4 days. If experiencing worsening of symptoms, fever, increased abdominal pain, increased blood in stool/blood in urine, continued dizziness despite hydration, go to the emergency department for further evaluation.

## 2017-02-23 NOTE — ED Provider Notes (Signed)
[place]    CSN: [id number] Arrival date & time: 02/23/17  1550     History   Chief Complaint Chief Complaint  Patient presents with  . Dizziness    HPI Deanna Haley is a 71 y.o. female.   71 year old female with history of hypertension, hyperlipidemia, chronic kidney disease, comes in for 2 day history of dizziness, diarrhea, nausea. Patient is deaf and video interpreter was used to obtain history of present illness. Patient states she went out to eat with friends 2 days ago. She ordered shrimp. She started feeling abdominal cramping on the way home from the restaurant. She states she's been having diarrhea every 2 hours, and noticed some blood when wiping. She states she has felt like she needed to vomit, but has not vomited. She was not able to eat or drink yesterday, but had breakfast this morning without vomiting. She states dizziness is noticed when she first stands up, when she feels the room spinning. She states she tried some Imodium, but the read online she should not use it, and discontinued that. Denies weakness, syncope. Denies dizziness while sitting down. Denies chest pain, shortness of breath. She is followed by a cardiologist who manages her hypertension due to family history of CAD.       Past Medical History:  Diagnosis Date  . Arthritis   . Deaf   . Essential hypertension 04/15/2012   Evaluated at [facility]- Aug 2010 with Dr. Tery Sanfilippo   . Hyperlipidemia with target LDL less than 100 04/15/2012    Patient Active Problem List   Diagnosis Date Noted  . Medication management 08/19/2016  . CKD (chronic kidney disease) stage 3, GFR 30-59 ml/min 08/19/2016  . Family history of cancer of genitourinary system 06/04/2016  . Mixed stress and urge urinary incontinence 11/21/2015  . Obesity (BMI 30.0-34.9) 07/08/2015  . Low bone mass 04/21/2015  . Family history of premature coronary artery disease 07/05/2014  .  Essential hypertension 04/15/2012  . Hyperlipidemia with target LDL less than 100 04/15/2012  . Deafness 04/15/2012    Past Surgical History:  Procedure Laterality Date  . ABDOMINAL HYSTERECTOMY     1994  . COLONOSCOPY    . DOPPLER ECHOCARDIOGRAPHY  08/30/2010   left ventricle is normal,EF= >55%  . MANDIBLE SURGERY  1986   per patient jaws lockup  . NM MYOCAR PERF WALL MOTION  08/29/2010   POST STRESS- EF 77% NO SIGNIFICANT WALL MOTION ABNORMALITIES,EXERCISE CAPCITY 7 METS ,LOW RISK SCAN  . POLYPECTOMY      OB History    No data available       Home Medications    Prior to Admission medications   Medication Sig Start Date End Date Taking? Authorizing Provider  amLODipine (NORVASC) 2.5 MG tablet Take 1 tablet (2.5 mg total) by mouth daily. 01/20/17 04/20/17  Leonie Man, MD  aspirin 81 MG tablet Take 81 mg by mouth daily.      [provider]  atorvastatin (LIPITOR) 10 MG tablet TAKE 1 TABLET (10 MG TOTAL) BY MOUTH DAILY. 11/22/16   Harrison Mons, PA-C  calcium-vitamin D (OSCAL WITH D) 500-200 MG-UNIT per tablet Take 1 tablet by mouth daily.      [provider]  losartan (COZAAR) 100 MG tablet TAKE 1 TABLET (100 MG TOTAL) BY MOUTH DAILY. 12/30/16   Jeffery, Chelle, PA-C  tizanidine (ZANAFLEX) 2 MG capsule TAKE 1-2 CAPSULES (2-4 MG TOTAL) BY MOUTH 3 (THREE) TIMES DAILY AS  NEEDED FOR MUSCLE SPASMS. 12/30/16   Harrison Mons, PA-C    Family History Family History  Problem Relation Age of Onset  . Diabetes Sister   . Cancer Sister        kidney  . Lupus Sister   . Cancer Brother        kidney, bladder, prostate  . Heart attack Mother   . Heart attack Father   . Diabetes Brother   . Stroke Brother   . Cancer Brother        kidney, bladder and prostate cancer  . Heart attack Sister   . Anemia Neg Hx     Social History Social History  Substance Use Topics  . Smoking status: Never Smoker  . Smokeless tobacco: Never Used  . Alcohol use 0.6  oz/week    1 Glasses of wine per week     Allergies   Chlorthalidone and Hctz [hydrochlorothiazide]   Review of Systems Review of Systems  Constitutional: Negative for chills, diaphoresis, fatigue and fever.  Gastrointestinal: Positive for abdominal pain, anal bleeding, diarrhea and nausea. Negative for abdominal distention, blood in stool, constipation, rectal pain and vomiting.  Neurological: Positive for dizziness. Negative for tremors, weakness, light-headedness, numbness and headaches.     Physical Exam Triage Vital Signs ED Triage Vitals [02/23/17 1609]  Enc Vitals Group     BP 107/71     Pulse Rate 93     Resp 18     Temp 98 F (36.7 C)     Temp Source Oral     SpO2 98 %     Weight      Height      Head Circumference      Peak Flow      Pain Score      Pain Loc      Pain Edu?      Excl. in [place]?    Orthostatic VS for the past 24 hrs:  BP- Lying Pulse- Lying BP- Sitting Pulse- Sitting BP- Standing at 0 minutes Pulse- Standing at 0 minutes  02/23/17 1638 115/67 62 118/79 72 129/74 84    Updated Vital Signs BP 107/71 (BP Location: Right Arm)   Pulse 93   Temp 98 F (36.7 C) (Oral)   Resp 18   SpO2 98%      Physical Exam  Constitutional: She is oriented to person, place, and time. She appears well-developed and well-nourished. No distress.  HENT:  Head: Normocephalic and atraumatic.  Eyes: Pupils are equal, round, and reactive to light. Conjunctivae and EOM are normal.  Cardiovascular: Normal rate, regular rhythm and normal heart sounds.  Exam reveals no gallop and no friction rub.   No murmur heard. Pulmonary/Chest: Effort normal and breath sounds normal. No respiratory distress. She has no wheezes. She has no rales.  Abdominal: Soft. Bowel sounds are normal. She exhibits no mass. There is tenderness (mild generalized tenderness). There is no rebound and no guarding.  Neurological: She is alert and oriented to person, place, and time. Coordination  normal.  Skin: Skin is warm and dry.     UC Treatments / Results  Labs (all labs ordered are listed, but only abnormal results are displayed) Labs Reviewed  POCT I-STAT, CHEM 8 - Abnormal; Notable for the following:       Result Value   BUN 25 (*)    Creatinine, Ser 1.10 (*)    Glucose, Bld 105 (*)    Calcium, Ion 1.45 (*)  All other components within normal limits    EKG  EKG Interpretation None       Radiology No results found.  Procedures Procedures (including critical care time)  Medications Ordered in UC Medications - No data to display   Initial Impression / Assessment and Plan / UC Course  I have reviewed the triage vital signs and the nursing notes.  Pertinent labs & imaging results that were available during my care of the patient were reviewed by me and considered in my medical decision making (see chart for details).    Discussed with patient this is could be due to dehydration. Patient to keep hydrated, follow planned diet during diarrhea. Probiotics after diarrhea resolves. Patient was unable to provide stool sample in office, collection kit provided for patient, patient to return with stool sample for culture. Patient with cardiologist follow-up, without current complaint of chest pain, shortness of breath, low suspicion for cardiac causes. Discussed patient to follow up with PCP if symptoms do not resolve. Return precautions given.   Final Clinical Impressions(s) / UC Diagnoses   Final diagnoses:  Diarrhea, unspecified type  Dizziness    New Prescriptions Discharge Medication List as of 02/23/2017  5:00 PM         Ok Edwards, PA-C 02/23/17 2128

## 2017-02-23 NOTE — ED Notes (Signed)
Pt provided with stool sample kit and instructed to bring sample to [facility] when she is able to obtain an adequate one.  Pt relayed understanding. 

## 2017-02-25 ENCOUNTER — Telehealth (HOSPITAL_COMMUNITY): Payer: Self-pay | Admitting: Physician Assistant

## 2017-02-25 NOTE — Telephone Encounter (Signed)
Patient dropped of stool sample for testing.

## 2017-02-26 LAB — GASTROINTESTINAL PANEL BY PCR, STOOL (REPLACES STOOL CULTURE)
Adenovirus F40/41: NOT DETECTED
Astrovirus: NOT DETECTED
CAMPYLOBACTER SPECIES: NOT DETECTED
CRYPTOSPORIDIUM: NOT DETECTED
Cyclospora cayetanensis: NOT DETECTED
ENTEROPATHOGENIC E COLI (EPEC): NOT DETECTED
Entamoeba histolytica: NOT DETECTED
Enteroaggregative E coli (EAEC): NOT DETECTED
Enterotoxigenic E coli (ETEC): NOT DETECTED
GIARDIA LAMBLIA: NOT DETECTED
NOROVIRUS GI/GII: NOT DETECTED
PLESIMONAS SHIGELLOIDES: NOT DETECTED
ROTAVIRUS A: NOT DETECTED
SHIGA LIKE TOXIN PRODUCING E COLI (STEC): NOT DETECTED
SHIGELLA/ENTEROINVASIVE E COLI (EIEC): NOT DETECTED
Salmonella species: NOT DETECTED
Sapovirus (I, II, IV, and V): NOT DETECTED
Vibrio cholerae: NOT DETECTED
Vibrio species: NOT DETECTED
YERSINIA ENTEROCOLITICA: NOT DETECTED

## 2017-04-30 ENCOUNTER — Ambulatory Visit (INDEPENDENT_AMBULATORY_CARE_PROVIDER_SITE_OTHER): Payer: Medicare Other

## 2017-04-30 VITALS — BP 128/80 | HR 85 | Temp 98.2°F | Ht 61.0 in | Wt 178.0 lb

## 2017-04-30 DIAGNOSIS — Z0000 Encounter for general adult medical examination without abnormal findings: Secondary | ICD-10-CM | POA: Diagnosis not present

## 2017-04-30 NOTE — Progress Notes (Signed)
Subjective:   Deanna Haley is a 71 y.o. female who presents for Medicare Annual (Subsequent) preventive examination.  Review of Systems:  N/A Cardiac Risk Factors include: advanced age (>43men, >46 women);hypertension;dyslipidemia;obesity (BMI >30kg/m2)     Objective:     Vitals: BP 128/80   Pulse 85   Temp 98.2 F (36.8 C) (Oral)   Ht 5\' 1"  (1.549 m)   Wt 178 lb (80.7 kg)   SpO2 97%   BMI 33.63 kg/m   Body mass index is 33.63 kg/m.   Tobacco History  Smoking Status  . Never Smoker  Smokeless Tobacco  . Never Used     Counseling given: Not Answered   Past Medical History:  Diagnosis Date  . Arthritis   . Deaf   . Essential hypertension 04/15/2012   Evaluated at [facility]- Aug 2010 with Dr. Tery Sanfilippo   . Hyperlipidemia with target LDL less than 100 04/15/2012   Past Surgical History:  Procedure Laterality Date  . ABDOMINAL HYSTERECTOMY     1994  . COLONOSCOPY    . DOPPLER ECHOCARDIOGRAPHY  08/30/2010   left ventricle is normal,EF= >55%  . MANDIBLE SURGERY  1986   per patient jaws lockup  . NM MYOCAR PERF WALL MOTION  08/29/2010   POST STRESS- EF 77% NO SIGNIFICANT WALL MOTION ABNORMALITIES,EXERCISE CAPCITY 7 METS ,LOW RISK SCAN  . POLYPECTOMY     Family History  Problem Relation Age of Onset  . Diabetes Sister   . Cancer Sister        kidney  . Lupus Sister   . Cancer Brother        kidney, bladder, prostate  . Heart attack Mother   . Heart attack Father   . Diabetes Brother   . Stroke Brother   . Cancer Brother        kidney, bladder and prostate cancer  . Heart attack Sister   . Anemia Neg Hx    History  Sexual Activity  . Sexual activity: Not on file    Outpatient Encounter Prescriptions as of 04/30/2017  Medication Sig  . aspirin 81 MG tablet Take 81 mg by mouth daily.    [name] atorvastatin (LIPITOR) 10 MG tablet TAKE 1 TABLET (10 MG TOTAL) BY MOUTH DAILY.  . calcium-vitamin D (OSCAL WITH D)  500-200 MG-UNIT per tablet Take 1 tablet by mouth daily.    [name] losartan (COZAAR) 100 MG tablet TAKE 1 TABLET (100 MG TOTAL) BY MOUTH DAILY.  . tizanidine (ZANAFLEX) 2 MG capsule TAKE 1-2 CAPSULES (2-4 MG TOTAL) BY MOUTH 3 (THREE) TIMES DAILY AS NEEDED FOR MUSCLE SPASMS.  [name] amLODipine (NORVASC) 2.5 MG tablet Take 1 tablet (2.5 mg total) by mouth daily.   No facility-administered encounter medications on file as of 04/30/2017.     Activities of Daily Living In your present state of health, do you have any difficulty performing the following activities: 04/30/2017 12/13/2016  Hearing? Y Y  Comment Patient is deaf and has an interpreter -  Vision? N Y  Difficulty concentrating or making decisions? Deanna Haley  Comment concentrating and remembering things is an issue at times -  Walking or climbing stairs? N N  Dressing or bathing? N N  Doing errands, shopping? N N  Preparing Food and eating ? N -  Using the Toilet? N -  In the past six months, have you accidently leaked urine? Y -  Comment Patient wears a pad daily for this  -  Do you have problems with loss of bowel control? N -  Managing your Medications? N -  Managing your Finances? N -  Housekeeping or managing your Housekeeping? N -  Some recent data might be hidden    Patient Care Team: Harrison Mons, PA-C as PCP - General (Physician Assistant) Rutherford Guys, MD as Consulting Physician (Ophthalmology) Leonie Man, MD as Consulting Physician (Cardiology)    Assessment:     Exercise Activities and Dietary recommendations Current Exercise Habits: The patient does not participate in regular exercise at present, Exercise limited by: None identified  Goals    . Weight (lb) < 140 lb (63.5 kg)          Patient states that she would like to try to lose some weight and weigh around 140 lbs in the near future.      Fall Risk Fall Risk  04/30/2017 12/13/2016 11/21/2015 04/28/2014  Falls in the past year? No No Yes No  Number falls  in past yr: - - 1 -  Injury with Fall? - - No -   Depression Screen PHQ 2/9 Scores 04/30/2017 12/13/2016 11/21/2015 03/07/2015  PHQ - 2 Score 0 0 0 0     Cognitive Function     6CIT Screen 04/30/2017  What Year? 0 points  What month? 0 points  What time? 0 points  Count back from 20 0 points  Months in reverse 0 points  Repeat phrase 0 points  Total Score 0    Immunization History  Administered Date(s) Administered  . Influenza Split 04/15/2012  . Influenza, High Dose Seasonal PF 04/16/2016  . Influenza,inj,Quad PF,6+ Mos 03/25/2013, 03/07/2015  . Influenza-Unspecified 04/08/2014, 04/09/2016, 04/18/2017  . Pneumococcal Conjugate-13 04/28/2014  . Pneumococcal Polysaccharide-23 03/18/2011, 06/04/2016  . Tdap 09/24/2012  . Zoster 03/02/2011   Screening Tests Health Maintenance  Topic Date Due  . COLONOSCOPY  01/13/2018  . MAMMOGRAM  01/01/2019  . TETANUS/TDAP  09/25/2022  . INFLUENZA VACCINE  Completed  . DEXA SCAN  Completed  . Hepatitis C Screening  Completed  . PNA vac Low Risk Adult  Completed      Plan:   I have personally reviewed and noted the following in the patient's chart:   . Medical and social history . Use of alcohol, tobacco or illicit drugs  . Current medications and supplements . Functional ability and status . Nutritional status . Physical activity . Advanced directives . List of other physicians . Hospitalizations, surgeries, and ER visits in previous 12 months . Vitals . Screenings to include cognitive, depression, and falls . Referrals and appointments  In addition, I have reviewed and discussed with patient certain preventive protocols, quality metrics, and best practice recommendations. A written personalized care plan for preventive services as well as general preventive health recommendations were provided to patient.     Andrez Grime, LPN  [id number]

## 2017-04-30 NOTE — Patient Instructions (Addendum)
Deanna Haley , Thank you for taking time to come for your Medicare Wellness Visit. I appreciate your ongoing commitment to your health goals. Please review the following plan we discussed and let me know if I can assist you in the future.   Screening recommendations/referrals: Colonoscopy: up to date, next due 01/13/2021 Mammogram: up to date, next due 01/01/2019 Bone Density: up to date, next due 04/04/2020 Recommended yearly ophthalmology/optometry visit for glaucoma screening and checkup Recommended yearly dental visit for hygiene and checkup  Vaccinations: Influenza vaccine: up to date Pneumococcal vaccine: up to date  Tdap vaccine: up to date, next due 08/31/2022 Shingles vaccine: up to date    Advanced directives: Advance directive discussed with you today. I have provided a copy for you to complete at home and have notarized. Once this is complete please bring a copy in to our office so we can scan it into your chart.   Conditions/risks identified: Try to lose some weight and weigh around 140 lbs in the near future.   Next appointment: 06/17/17 @ 8:40 am with [place] 65 Years and Older, Female Preventive care refers to lifestyle choices and visits with your health care provider that can promote health and wellness. What does preventive care include?  A yearly physical exam. This is also called an annual well check.  Dental exams once or twice a year.  Routine eye exams. Ask your health care provider how often you should have your eyes checked.  Personal lifestyle choices, including:  Daily care of your teeth and gums.  Regular physical activity.  Eating a healthy diet.  Avoiding tobacco and drug use.  Limiting alcohol use.  Practicing safe sex.  Taking low-dose aspirin every day.  Taking vitamin and mineral supplements as recommended by your health care provider. What happens during an annual well check? The services and screenings done  by your health care provider during your annual well check will depend on your age, overall health, lifestyle risk factors, and family history of disease. Counseling  Your health care provider may ask you questions about your:  Alcohol use.  Tobacco use.  Drug use.  Emotional well-being.  Home and relationship well-being.  Sexual activity.  Eating habits.  History of falls.  Memory and ability to understand (cognition).  Work and work Statistician.  Reproductive health. Screening  You may have the following tests or measurements:  Height, weight, and BMI.  Blood pressure.  Lipid and cholesterol levels. These may be checked every 5 years, or more frequently if you are over 65 years old.  Skin check.  Lung cancer screening. You may have this screening every year starting at age 91 if you have a 30-pack-year history of smoking and currently smoke or have quit within the past 15 years.  Fecal occult blood test (FOBT) of the stool. You may have this test every year starting at age 23.  Flexible sigmoidoscopy or colonoscopy. You may have a sigmoidoscopy every 5 years or a colonoscopy every 10 years starting at age 64.  Hepatitis C blood test.  Hepatitis B blood test.  Sexually transmitted disease (STD) testing.  Diabetes screening. This is done by checking your blood sugar (glucose) after you have not eaten for a while (fasting). You may have this done every 1-3 years.  Bone density scan. This is done to screen for osteoporosis. You may have this done starting at age 73.  Mammogram. This may be done every 1-2 years. Talk to  your health care provider about how often you should have regular mammograms. Talk with your health care provider about your test results, treatment options, and if necessary, the need for more tests. Vaccines  Your health care provider may recommend certain vaccines, such as:  Influenza vaccine. This is recommended every year.  Tetanus,  diphtheria, and acellular pertussis (Tdap, Td) vaccine. You may need a Td booster every 10 years.  Zoster vaccine. You may need this after age 9.  Pneumococcal 13-valent conjugate (PCV13) vaccine. One dose is recommended after age 88.  Pneumococcal polysaccharide (PPSV23) vaccine. One dose is recommended after age 23. Talk to your health care provider about which screenings and vaccines you need and how often you need them. This information is not intended to replace advice given to you by your health care provider. Make sure you discuss any questions you have with your health care provider. Document Released: 07/14/2015 Document Revised: 03/06/2016 Document Reviewed: 04/18/2015 Elsevier Interactive Patient Education  2017 [place] Prevention in the Home Falls can cause injuries. They can happen to people of all ages. There are many things you can do to make your home safe and to help prevent falls. What can I do on the outside of my home?  Regularly fix the edges of walkways and driveways and fix any cracks.  Remove anything that might make you trip as you walk through a door, such as a raised step or threshold.  Trim any bushes or trees on the path to your home.  Use bright outdoor lighting.  Clear any walking paths of anything that might make someone trip, such as rocks or tools.  Regularly check to see if handrails are loose or broken. Make sure that both sides of any steps have handrails.  Any raised decks and porches should have guardrails on the edges.  Have any leaves, snow, or ice cleared regularly.  Use sand or salt on walking paths during winter.  Clean up any spills in your garage right away. This includes oil or grease spills. What can I do in the bathroom?  Use night lights.  Install grab bars by the toilet and in the tub and shower. Do not use towel bars as grab bars.  Use non-skid mats or decals in the tub or shower.  If you need to sit down in  the shower, use a plastic, non-slip stool.  Keep the floor dry. Clean up any water that spills on the floor as soon as it happens.  Remove soap buildup in the tub or shower regularly.  Attach bath mats securely with double-sided non-slip rug tape.  Do not have throw rugs and other things on the floor that can make you trip. What can I do in the bedroom?  Use night lights.  Make sure that you have a light by your bed that is easy to reach.  Do not use any sheets or blankets that are too big for your bed. They should not hang down onto the floor.  Have a firm chair that has side arms. You can use this for support while you get dressed.  Do not have throw rugs and other things on the floor that can make you trip. What can I do in the kitchen?  Clean up any spills right away.  Avoid walking on wet floors.  Keep items that you use a lot in easy-to-reach places.  If you need to reach something above you, use a strong step stool that has  a grab bar.  Keep electrical cords out of the way.  Do not use floor polish or wax that makes floors slippery. If you must use wax, use non-skid floor wax.  Do not have throw rugs and other things on the floor that can make you trip. What can I do with my stairs?  Do not leave any items on the stairs.  Make sure that there are handrails on both sides of the stairs and use them. Fix handrails that are broken or loose. Make sure that handrails are as long as the stairways.  Check any carpeting to make sure that it is firmly attached to the stairs. Fix any carpet that is loose or worn.  Avoid having throw rugs at the top or bottom of the stairs. If you do have throw rugs, attach them to the floor with carpet tape.  Make sure that you have a light switch at the top of the stairs and the bottom of the stairs. If you do not have them, ask someone to add them for you. What else can I do to help prevent falls?  Wear shoes that:  Do not have high  heels.  Have rubber bottoms.  Are comfortable and fit you well.  Are closed at the toe. Do not wear sandals.  If you use a stepladder:  Make sure that it is fully opened. Do not climb a closed stepladder.  Make sure that both sides of the stepladder are locked into place.  Ask someone to hold it for you, if possible.  Clearly mark and make sure that you can see:  Any grab bars or handrails.  First and last steps.  Where the edge of each step is.  Use tools that help you move around (mobility aids) if they are needed. These include:  Canes.  Walkers.  Scooters.  Crutches.  Turn on the lights when you go into a dark area. Replace any light bulbs as soon as they burn out.  Set up your furniture so you have a clear path. Avoid moving your furniture around.  If any of your floors are uneven, fix them.  If there are any pets around you, be aware of where they are.  Review your medicines with your doctor. Some medicines can make you feel dizzy. This can increase your chance of falling. Ask your doctor what other things that you can do to help prevent falls. This information is not intended to replace advice given to you by your health care provider. Make sure you discuss any questions you have with your health care provider. Document Released: 04/13/2009 Document Revised: 11/23/2015 Document Reviewed: 07/22/2014 Elsevier Interactive Patient Education  2017 [employer].

## 2017-05-08 ENCOUNTER — Other Ambulatory Visit: Payer: Self-pay | Admitting: Physician Assistant

## 2017-05-09 NOTE — Telephone Encounter (Signed)
Medication not on our list to refill.Thanks

## 2017-05-22 ENCOUNTER — Other Ambulatory Visit: Payer: Self-pay | Admitting: Cardiology

## 2017-06-17 ENCOUNTER — Ambulatory Visit: Payer: Medicare Other | Admitting: Physician Assistant

## 2017-06-17 ENCOUNTER — Encounter: Payer: Self-pay | Admitting: Physician Assistant

## 2017-06-17 ENCOUNTER — Other Ambulatory Visit: Payer: Self-pay

## 2017-06-17 ENCOUNTER — Ambulatory Visit (INDEPENDENT_AMBULATORY_CARE_PROVIDER_SITE_OTHER): Payer: Medicare Other

## 2017-06-17 VITALS — BP 128/80 | HR 85 | Temp 97.9°F | Resp 18 | Ht 61.0 in | Wt 180.2 lb

## 2017-06-17 DIAGNOSIS — E010 Iodine-deficiency related diffuse (endemic) goiter: Secondary | ICD-10-CM

## 2017-06-17 DIAGNOSIS — M79604 Pain in right leg: Secondary | ICD-10-CM | POA: Diagnosis not present

## 2017-06-17 DIAGNOSIS — M8580 Other specified disorders of bone density and structure, unspecified site: Secondary | ICD-10-CM | POA: Diagnosis not present

## 2017-06-17 DIAGNOSIS — N183 Chronic kidney disease, stage 3 unspecified: Secondary | ICD-10-CM

## 2017-06-17 DIAGNOSIS — E785 Hyperlipidemia, unspecified: Secondary | ICD-10-CM

## 2017-06-17 DIAGNOSIS — E2839 Other primary ovarian failure: Secondary | ICD-10-CM | POA: Diagnosis not present

## 2017-06-17 DIAGNOSIS — I10 Essential (primary) hypertension: Secondary | ICD-10-CM | POA: Diagnosis not present

## 2017-06-17 NOTE — Progress Notes (Signed)
Patient ID: Deanna Haley, female    DOB: 09/04/1945, 71 y.o.   MRN: [id number]  PCP: Harrison Mons, PA-C  Chief Complaint  Patient presents with  . Hypertension  . Follow-up    Subjective:   Presents for evaluation of HTN. She is accompanied by a sign language interpreter.  Overall, feels well.  Returned from a cruise 2 weeks ago. Had a wonderful and relaxing time. Had a sore throat before her cruise. Took some medicine, which helped. Her thyroid seemed swollen at the time, but not tender. She'd like it checked..  Leg pain persists. RIGHT. Occurs with walking, standing, sometimes when sitting. Antero-Lateral leg, from above the ankle to just below the knee. Throbbing, like a tooth ache. 8/10 Gabapentin worked initially, but then the effectiveness waned. Tizanidine was ineffective.   Review of Systems     Patient Active Problem List   Diagnosis Date Noted  . Medication management 08/19/2016  . CKD (chronic kidney disease) stage 3, GFR 30-59 ml/min (HCC) 08/19/2016  . Family history of cancer of genitourinary system 06/04/2016  . Mixed stress and urge urinary incontinence 11/21/2015  . Obesity (BMI 30.0-34.9) 07/08/2015  . Low bone mass 04/21/2015  . Family history of premature coronary artery disease 07/05/2014  . Essential hypertension 04/15/2012  . Hyperlipidemia with target LDL less than 100 04/15/2012  . Deafness 04/15/2012     Prior to Admission medications   Medication Sig Start Date End Date Taking? Authorizing Provider  amLODipine (NORVASC) 2.5 MG tablet TAKE 1 TABLET BY MOUTH EVERY DAY 05/26/17  Yes Leonie Man, MD  aspirin 81 MG tablet Take 81 mg by mouth daily.     Yes [provider]  atorvastatin (LIPITOR) 10 MG tablet TAKE 1 TABLET (10 MG TOTAL) BY MOUTH DAILY. 11/22/16  Yes Elliona Doddridge, PA-C  calcium-vitamin D (OSCAL WITH D) 500-200 MG-UNIT per tablet Take 1 tablet by mouth daily.     Yes [provider]    losartan (COZAAR) 100 MG tablet TAKE 1 TABLET (100 MG TOTAL) BY MOUTH DAILY. 12/30/16  Yes Kym Scannell, PA-C  tizanidine (ZANAFLEX) 2 MG capsule TAKE 1-2 CAPSULES (2-4 MG TOTAL) BY MOUTH 3 (THREE) TIMES DAILY AS NEEDED FOR MUSCLE SPASMS. 12/30/16  Yes Chantay Whitelock, PA-C     Allergies  Allergen Reactions  . Chlorthalidone     dehydration  . Hctz [Hydrochlorothiazide] Other (See Comments)    Rise in Cr, Ca & BUN -- dehydration       Objective:  Physical Exam  Constitutional: She is oriented to person, place, and time. She appears well-developed and well-nourished. She is active and cooperative. No distress.  BP 128/80 (BP Location: Left Arm, Patient Position: Sitting, Cuff Size: Normal)   Pulse 85   Temp 97.9 F (36.6 C) (Oral)   Resp 18   Ht 5\' 1"  (1.549 m)   Wt 180 lb 3.2 oz (81.7 kg)   SpO2 95%   BMI 34.05 kg/m   HENT:  Head: Normocephalic and atraumatic.  Right Ear: Hearing normal.  Left Ear: Hearing normal.  Eyes: Conjunctivae are normal. No scleral icterus.  Neck: Normal range of motion. Neck supple. Thyromegaly (no nodules) present. No thyroid mass present.  Cardiovascular: Normal rate, regular rhythm and normal heart sounds.  Pulses:      Radial pulses are 2+ on the right side, and 2+ on the left side.  Pulmonary/Chest: Effort normal and breath sounds normal.  Musculoskeletal:  Right knee: She exhibits normal range of motion, no swelling, no effusion and no bony tenderness. No tenderness found.       Right ankle: Normal. Achilles tendon normal.       Right upper leg: Normal.       Left upper leg: Normal.       Right lower leg: She exhibits no tenderness, no bony tenderness, no swelling, no edema, no deformity and no laceration.       Left lower leg: She exhibits no tenderness, no bony tenderness, no swelling, no edema, no deformity and no laceration.       Legs: Lymphadenopathy:       Head (right side): No tonsillar, no preauricular, no posterior  auricular and no occipital adenopathy present.       Head (left side): No tonsillar, no preauricular, no posterior auricular and no occipital adenopathy present.    She has no cervical adenopathy.       Right: No supraclavicular adenopathy present.       Left: No supraclavicular adenopathy present.  Neurological: She is alert and oriented to person, place, and time. No sensory deficit.  Skin: Skin is warm, dry and intact. No rash noted. No cyanosis or erythema. Nails show no clubbing.  Psychiatric: She has a normal mood and affect. Her speech is normal and behavior is normal.    Dg Tibia/fibula Right  Result Date: 06/17/2017 CLINICAL DATA:  Right leg pain. EXAM: RIGHT TIBIA AND FIBULA - 2 VIEW COMPARISON:  No prior. FINDINGS: Corticated bony density noted adjacent to the medial malleolus most likely an old fracture fragment. Similar finding noted along the posterior aspect of the distal tibia. Lateral malleolus intact. IMPRESSION: Tiny corticated bony densities noted adjacent to the medial malleolus and the posterior aspect of the distal tibia. These are most likely old fracture fragments. No other focal abnormality identified. Electronically Signed   By: Marcello Moores  Register   On: 06/17/2017 09:56          Assessment & Plan:   Problem List Items Addressed This Visit    Essential hypertension - Primary (Chronic)    COntrolled. Continue current treatment.      Relevant Orders   CBC with Differential/Platelet (Completed)   Comprehensive metabolic panel (Completed)   TSH (Completed)   Hyperlipidemia with target LDL less than 100 (Chronic)    Await labs. Adjust regimen as indicated by results.       Relevant Orders   Lipid panel (Completed)   Low bone mass    Calcium and vitamin D. Update DEXA.      Relevant Orders   DG Bone Density   CKD (chronic kidney disease) stage 3, GFR 30-59 ml/min (HCC)    Has been stable. Await labs.      Relevant Orders   Comprehensive metabolic  panel (Completed)    Other Visit Diagnoses    Thyromegaly       Relevant Orders   TSH (Completed)   Leg pain, anterior, right       Relevant Orders   DG Tibia/Fibula Right (Completed)   Estrogen deficiency       Relevant Orders   DG Bone Density       Return in about 6 months (around 12/16/2017) for re-evaluation of blood pressure and cholesterol.   Fara Chute, PA-C [facility]

## 2017-06-17 NOTE — Patient Instructions (Signed)
     IF you received an x-ray today, you will receive an invoice from [facility]. Please contact [facility] at [phone] with questions or concerns regarding your invoice.   IF you received labwork today, you will receive an invoice from [facility]. Please contact [facility] at [phone] with questions or concerns regarding your invoice.   Our billing staff will not be able to assist you with questions regarding bills from these companies.  You will be contacted with the lab results as soon as they are available. The fastest way to get your results is to activate your My Chart account. Instructions are located on the last page of this paperwork. If you have not heard from us regarding the results in 2 weeks, please contact this office.     

## 2017-06-18 LAB — CBC WITH DIFFERENTIAL/PLATELET
BASOS ABS: 0 10*3/uL (ref 0.0–0.2)
Basos: 0 %
EOS (ABSOLUTE): 0.2 10*3/uL (ref 0.0–0.4)
Eos: 2 %
HEMOGLOBIN: 11.9 g/dL (ref 11.1–15.9)
Hematocrit: 36.5 % (ref 34.0–46.6)
Immature Grans (Abs): 0 10*3/uL (ref 0.0–0.1)
Immature Granulocytes: 0 %
LYMPHS ABS: 3 10*3/uL (ref 0.7–3.1)
Lymphs: 33 %
MCH: 26.9 pg (ref 26.6–33.0)
MCHC: 32.6 g/dL (ref 31.5–35.7)
MCV: 83 fL (ref 79–97)
MONOCYTES: 4 %
Monocytes Absolute: 0.4 10*3/uL (ref 0.1–0.9)
NEUTROS ABS: 5.5 10*3/uL (ref 1.4–7.0)
Neutrophils: 61 %
Platelets: 311 10*3/uL (ref 150–379)
RBC: 4.42 x10E6/uL (ref 3.77–5.28)
RDW: 16.3 % — ABNORMAL HIGH (ref 12.3–15.4)
WBC: 9 10*3/uL (ref 3.4–10.8)

## 2017-06-18 LAB — LIPID PANEL
CHOLESTEROL TOTAL: 205 mg/dL — AB (ref 100–199)
Chol/HDL Ratio: 2.2 ratio (ref 0.0–4.4)
HDL: 95 mg/dL (ref >39)
LDL Calculated: 93 mg/dL (ref 0–99)
Triglycerides: 83 mg/dL (ref 0–149)
VLDL Cholesterol Cal: 17 mg/dL (ref 5–40)

## 2017-06-18 LAB — COMPREHENSIVE METABOLIC PANEL
A/G RATIO: 1.3 (ref 1.2–2.2)
ALBUMIN: 4.3 g/dL (ref 3.5–4.8)
ALK PHOS: 125 IU/L — AB (ref 39–117)
ALT: 14 IU/L (ref 0–32)
AST: 19 IU/L (ref 0–40)
BILIRUBIN TOTAL: 0.3 mg/dL (ref 0.0–1.2)
BUN / CREAT RATIO: 19 (ref 12–28)
BUN: 19 mg/dL (ref 8–27)
CO2: 24 mmol/L (ref 20–29)
Calcium: 10.5 mg/dL — ABNORMAL HIGH (ref 8.7–10.3)
Chloride: 105 mmol/L (ref 96–106)
Creatinine, Ser: 1 mg/dL (ref 0.57–1.00)
GFR calc Af Amer: 66 mL/min/{1.73_m2} (ref >59)
GFR calc non Af Amer: 57 mL/min/{1.73_m2} — ABNORMAL LOW (ref >59)
GLOBULIN, TOTAL: 3.4 g/dL (ref 1.5–4.5)
GLUCOSE: 85 mg/dL (ref 65–99)
POTASSIUM: 4.3 mmol/L (ref 3.5–5.2)
Sodium: 142 mmol/L (ref 134–144)
Total Protein: 7.7 g/dL (ref 6.0–8.5)

## 2017-06-18 LAB — TSH: TSH: 1.27 u[IU]/mL (ref 0.450–4.500)

## 2017-06-29 NOTE — Assessment & Plan Note (Signed)
Await labs. Adjust regimen as indicated by results.  

## 2017-06-29 NOTE — Assessment & Plan Note (Signed)
Has been stable. Await labs.

## 2017-06-29 NOTE — Assessment & Plan Note (Signed)
Calcium and vitamin D. Update DEXA.

## 2017-06-29 NOTE — Assessment & Plan Note (Signed)
COntrolled. Continue current treatment.

## 2017-08-14 ENCOUNTER — Other Ambulatory Visit: Payer: Self-pay | Admitting: Physician Assistant

## 2017-08-25 ENCOUNTER — Other Ambulatory Visit: Payer: Medicare Other

## 2017-08-29 ENCOUNTER — Ambulatory Visit
Admission: RE | Admit: 2017-08-29 | Discharge: 2017-08-29 | Disposition: A | Discharge status: Home / Self Care | Payer: Medicare Other | Source: Ambulatory Visit | Attending: Physician Assistant | Admitting: Physician Assistant

## 2017-08-29 DIAGNOSIS — M8580 Other specified disorders of bone density and structure, unspecified site: Secondary | ICD-10-CM

## 2017-08-29 DIAGNOSIS — E2839 Other primary ovarian failure: Secondary | ICD-10-CM

## 2017-08-31 ENCOUNTER — Encounter: Payer: Self-pay | Admitting: Physician Assistant

## 2017-09-05 ENCOUNTER — Encounter: Payer: Self-pay | Admitting: Cardiology

## 2017-09-05 ENCOUNTER — Ambulatory Visit: Payer: Medicare Other | Admitting: Cardiology

## 2017-09-05 VITALS — BP 118/76 | HR 86 | Ht 61.0 in | Wt 183.8 lb

## 2017-09-05 DIAGNOSIS — N183 Chronic kidney disease, stage 3 unspecified: Secondary | ICD-10-CM

## 2017-09-05 DIAGNOSIS — E785 Hyperlipidemia, unspecified: Secondary | ICD-10-CM | POA: Diagnosis not present

## 2017-09-05 DIAGNOSIS — E669 Obesity, unspecified: Secondary | ICD-10-CM

## 2017-09-05 DIAGNOSIS — I10 Essential (primary) hypertension: Secondary | ICD-10-CM

## 2017-09-05 DIAGNOSIS — Z8249 Family history of ischemic heart disease and other diseases of the circulatory system: Secondary | ICD-10-CM

## 2017-09-05 NOTE — Progress Notes (Signed)
PCP: Harrison Mons, PA-C  Clinic Note: Chief Complaint  Patient presents with  . Follow-up    No symptoms    HPI: Deanna Haley is a 71 y.o. female with a PMH below who presents today for Annual follow-up prior cardiac risk factors of hypertension and dyslipidemia. Previously evaluated for chest discomfort with an echocardiogram and Myoview in 2012 that were normal. Former patient of Dr. Tery Sanfilippo. She continues to follow with a cardiologist due to her significant family history of CAD.  History is provided with the assistance of a contracted sign language interpreter as she is deaf.[name]  Deanna Haley was last seen on01/26/2018. --Was doing very well.  Usually joking and smiling.  No symptoms. She was doing well without any major complaints. Unfortunately she gained about 20 pounds.  Recent Hospitalizations: None  Studies Reviewed: None  Interval History: Deanna Haley is here again doing very well.  She just got back from a cruise to [place], and is planning on going on a trip to [place] soon.  She is out and about doing what she wants to do and denies any symptoms whatsoever cardiac standpoint.  She actually is happy that she has not gained any weight since the last time I saw her.  She does not seem to be having any issues with her medications, no myalgias from atorvastatin.  No lightheadedness or dizziness to suggest orthostatic symptoms.  Cardiovascular ROS: no chest pain or dyspnea on exertion negative for - edema, irregular heartbeat, murmur, orthopnea, palpitations, paroxysmal nocturnal dyspnea, rapid heart rate, shortness of breath or Syncope/near syncope, TIA/amaurosis, claudication.  ROS: A comprehensive was performed. Review of Systems  Constitutional: Negative for chills, fever and malaise/fatigue.  HENT: Negative for congestion and nosebleeds.   Respiratory: Negative for cough, shortness of breath and wheezing.   Gastrointestinal: Negative for blood in stool and  melena.  Genitourinary: Negative for hematuria.  Musculoskeletal: Positive for joint pain (stable arthritis). Negative for myalgias.  Neurological: Negative for dizziness.  Psychiatric/Behavioral: Negative.   All other systems reviewed and are negative.   Past Medical History:  Diagnosis Date  . Arthritis   . Deaf   . Essential hypertension 04/15/2012   Evaluated at [facility]- Aug 2010 with Dr. Tery Sanfilippo   . Hyperlipidemia with target LDL less than 100 04/15/2012    Past Surgical History:  Procedure Laterality Date  . ABDOMINAL HYSTERECTOMY     1994  . COLONOSCOPY    . DOPPLER ECHOCARDIOGRAPHY  08/30/2010   left ventricle is normal,EF= >55%  . MANDIBLE SURGERY  1986   per patient jaws lockup  . NM MYOCAR PERF WALL MOTION  08/29/2010   POST STRESS- EF 77% NO SIGNIFICANT WALL MOTION ABNORMALITIES,EXERCISE CAPCITY 7 METS ,LOW RISK SCAN  . POLYPECTOMY      Current Meds  Medication Sig  . amLODipine (NORVASC) 2.5 MG tablet TAKE 1 TABLET BY MOUTH EVERY DAY  . aspirin 81 MG tablet Take 81 mg by mouth daily.    [name] atorvastatin (LIPITOR) 10 MG tablet TAKE 1 TABLET (10 MG TOTAL) BY MOUTH DAILY.  . calcium-vitamin D (OSCAL WITH D) 500-200 MG-UNIT per tablet Take 1 tablet by mouth daily.    [name] losartan (COZAAR) 100 MG tablet TAKE 1 TABLET (100 MG TOTAL) BY MOUTH DAILY.    Allergies  Allergen Reactions  . Chlorthalidone     dehydration  . Hctz [Hydrochlorothiazide] Other (See Comments)    Rise in Cr, Ca & BUN -- dehydration  Social History   Socioeconomic History  . Marital status: Married    Spouse name: Deanna Haley  . Number of children: 3  . Years of education: None  . Highest education level: None  Social Needs  . Financial resource strain: None  . Food insecurity - worry: None  . Food insecurity - inability: None  . Transportation needs - medical: None  . Transportation needs - non-medical: None  Occupational History  . Occupation:  custodian  Tobacco Use  . Smoking status: Never Smoker  . Smokeless tobacco: Never Used  Substance and Sexual Activity  . Alcohol use: Yes    Alcohol/week: 3.0 oz    Types: 5 Glasses of wine per week  . Drug use: No  . Sexual activity: None  Other Topics Concern  . None  Social History Narrative   Lives with her husband.   Their 3 adult children live locally.    family history includes Cancer in her brother, brother, and sister; Diabetes in her brother and sister; Heart attack in her father, mother, and sister; Lupus in her sister; Stroke in her brother.  Wt Readings from Last 3 Encounters:  09/05/17 183 lb 12.8 oz (83.4 kg)  06/17/17 180 lb 3.2 oz (81.7 kg)  04/30/17 178 lb (80.7 kg)  January 2017 weight was 177 pounds (80.6 kg)  PHYSICAL EXAM BP 118/76 (BP Location: Right Arm, Patient Position: Sitting, Cuff Size: Normal)   Pulse 86   Ht 5\' 1"  (1.549 m)   Wt 183 lb 12.8 oz (83.4 kg)   BMI 34.73 kg/m   Physical Exam  Constitutional: She is oriented to person, place, and time. She appears well-developed and well-nourished.  Well-groomed.  Healthy-appearing.  Mildly obese  HENT:  Head: Normocephalic and atraumatic.  Deaf  Eyes: EOM are normal.  Neck: No hepatojugular reflux and no JVD present. Carotid bruit is not present.  Cardiovascular: Normal rate, regular rhythm, normal heart sounds, intact distal pulses and normal pulses.  No extrasystoles are present. PMI is not displaced. Exam reveals no gallop and no friction rub.  No murmur heard. Pulmonary/Chest: Effort normal. No respiratory distress. She has no wheezes. She has no rales.  Abdominal: Soft. Bowel sounds are normal. She exhibits no distension. There is no tenderness. There is no rebound.  Musculoskeletal: Normal range of motion. She exhibits no edema.  Neurological: She is alert and oriented to person, place, and time.  Psychiatric: She has a normal mood and affect. Her behavior is normal. Judgment and thought  content normal.     Adult ECG Report  Rate: 86;  Rhythm: normal sinus rhythm; normal axis, intervals and durations. Cannot exclude septal MI, age undetermined.  Narrative Interpretation: Stable EKG   Other studies Reviewed: Additional studies/ records that were reviewed today include:  Recent Labs:   Lab Results  Component Value Date   CHOL 205 (H) 06/17/2017   HDL 95 06/17/2017   LDLCALC 93 06/17/2017   LDLDIRECT 87 10/27/2014   TRIG 83 06/17/2017   CHOLHDL 2.2 06/17/2017   Lab Results  Component Value Date   CREATININE 1.00 06/17/2017   BUN 19 06/17/2017   NA 142 06/17/2017   K 4.3 06/17/2017   CL 105 06/17/2017   CO2 24 06/17/2017    ASSESSMENT / PLAN: Problem List Items Addressed This Visit    Obesity (BMI 30.0-34.9) (Chronic)    Thankfully, she is stabilized on her weight.  Hoping to try to now start losing.  Continue exercise with dietary modification.  Hyperlipidemia with target LDL less than 100 - Primary (Chronic)    Lipids look pretty good based on last labs.  Cholesterol level is 205 so we talked about the importance of trying to do some weight loss and exercise and hopefully that will help with overall cholesterol.  LDL seems to be at goal with HDL also being increased. Continue current dose statin      Family history of premature coronary artery disease (Chronic)    No active cardiology symptoms.  Continue to treat cardiac risk factors.   She had a negative cardiac evaluation in the past. -      Essential hypertension (Chronic)    Well-controlled blood pressure on current meds.  No change.      Relevant Orders   EKG 12-Lead   CKD (chronic kidney disease) stage 3, GFR 30-59 ml/min (HCC)      Current medicines are reviewed at length with the patient today. (+/- concerns) n/a The following changes have been made: n/a  Patient Instructions  Medication Instructions:  Your physician recommends that you continue on your current medications as  directed. Please refer to the Current Medication list given to you today.  Labwork: Please return for FASTING labs in 12 months (prior to office visit with Dr. Ellyn Hack) (CMET, Lipid)  ---we will mail you the lab slips when it is time for the labs to be drawn.   Follow-Up: Your physician wants you to follow-up in: 12 months with Dr. Ellyn Hack.  You will receive a reminder letter in the mail two months in advance. If you don't receive a letter, please call our office to schedule the follow-up appointment.    If you need a refill on your cardiac medications before your next appointment, please call your pharmacy.     Studies Ordered:   Orders Placed This Encounter  Procedures  . EKG 12-Lead      Glenetta Hew, M.D., M.S. Interventional Cardiologist   Pager # [phone] Phone # [phone] [address] [place], [place] [address]

## 2017-09-05 NOTE — Patient Instructions (Addendum)
Medication Instructions:  Your physician recommends that you continue on your current medications as directed. Please refer to the Current Medication list given to you today.  Labwork: Please return for FASTING labs in 12 months (prior to office visit with Dr. Ellyn Hack) (CMET, Lipid)  ---we will mail you the lab slips when it is time for the labs to be drawn.   Follow-Up: Your physician wants you to follow-up in: 12 months with Dr. Ellyn Hack.  You will receive a reminder letter in the mail two months in advance. If you don't receive a letter, please call our office to schedule the follow-up appointment.    If you need a refill on your cardiac medications before your next appointment, please call your pharmacy.

## 2017-09-07 ENCOUNTER — Encounter: Payer: Self-pay | Admitting: Cardiology

## 2017-09-07 NOTE — Assessment & Plan Note (Signed)
Thankfully, she is stabilized on her weight.  Hoping to try to now start losing.  Continue exercise with dietary modification.

## 2017-09-07 NOTE — Assessment & Plan Note (Signed)
Lipids look pretty good based on last labs.  Cholesterol level is 205 so we talked about the importance of trying to do some weight loss and exercise and hopefully that will help with overall cholesterol.  LDL seems to be at goal with HDL also being increased. Continue current dose statin

## 2017-09-07 NOTE — Assessment & Plan Note (Signed)
No active cardiology symptoms.  Continue to treat cardiac risk factors.   She had a negative cardiac evaluation in the past. -

## 2017-09-07 NOTE — Assessment & Plan Note (Signed)
Well-controlled blood pressure on current meds.  No change. °

## 2017-09-15 ENCOUNTER — Other Ambulatory Visit: Payer: Self-pay | Admitting: *Deleted

## 2017-09-15 MED ORDER — AMLODIPINE BESYLATE 2.5 MG PO TABS: 2.5000 mg | ORAL_TABLET | Freq: Every day | ORAL | 3 refills | Status: DC

## 2017-10-06 ENCOUNTER — Encounter: Payer: Self-pay | Admitting: Physician Assistant

## 2017-10-10 ENCOUNTER — Telehealth: Payer: Self-pay | Admitting: Physician Assistant

## 2017-10-10 NOTE — Telephone Encounter (Signed)
Copied from [place] [id number]. Topic: Quick Communication - Rx Refill/Question >> Oct 10, 2017  9:26 AM Arletha Grippe wrote: Medication: medication to help relax during flying  Has the patient contacted their pharmacy? No. - new med  (Agent: If no, request that the patient contact the pharmacy for the refill.) Preferred Pharmacy (with phone number or street name): [facility]  Agent: Please be advised that RX refills may take up to 3 business days. We ask that you follow-up with your pharmacy.

## 2017-10-10 NOTE — Telephone Encounter (Signed)
Pt requesting medication to help her relax during flying.

## 2017-10-10 NOTE — Telephone Encounter (Signed)
Patient needs office visit for new prescriptions. Please set up appointment.

## 2017-10-13 NOTE — Telephone Encounter (Signed)
Spoke with pt today- made an appt with Chelle for tomorrow at 5:00 .  Advised pt of time, building number and late policy.

## 2017-10-14 ENCOUNTER — Encounter: Payer: Self-pay | Admitting: Physician Assistant

## 2017-10-14 ENCOUNTER — Ambulatory Visit (INDEPENDENT_AMBULATORY_CARE_PROVIDER_SITE_OTHER): Payer: Medicare Other | Admitting: Physician Assistant

## 2017-10-14 ENCOUNTER — Other Ambulatory Visit: Payer: Self-pay

## 2017-10-14 ENCOUNTER — Telehealth: Payer: Self-pay | Admitting: Physician Assistant

## 2017-10-14 VITALS — BP 118/76 | HR 98 | Temp 97.8°F | Resp 16 | Ht 61.0 in | Wt 177.8 lb

## 2017-10-14 DIAGNOSIS — E785 Hyperlipidemia, unspecified: Secondary | ICD-10-CM | POA: Diagnosis not present

## 2017-10-14 DIAGNOSIS — I10 Essential (primary) hypertension: Secondary | ICD-10-CM | POA: Diagnosis not present

## 2017-10-14 DIAGNOSIS — F40243 Fear of flying: Secondary | ICD-10-CM

## 2017-10-14 DIAGNOSIS — Z86010 Personal history of colonic polyps: Secondary | ICD-10-CM | POA: Insufficient documentation

## 2017-10-14 DIAGNOSIS — M48061 Spinal stenosis, lumbar region without neurogenic claudication: Secondary | ICD-10-CM | POA: Insufficient documentation

## 2017-10-14 MED ORDER — ALPRAZOLAM 0.5 MG PO TABS: 0.2500 mg | ORAL_TABLET | Freq: Three times a day (TID) | ORAL | 0 refills | Status: DC | PRN

## 2017-10-14 MED ORDER — LOSARTAN POTASSIUM 100 MG PO TABS: 100.0000 mg | ORAL_TABLET | Freq: Every day | ORAL | 3 refills | Status: DC

## 2017-10-14 MED ORDER — ATORVASTATIN CALCIUM 10 MG PO TABS: 10.0000 mg | ORAL_TABLET | Freq: Every day | ORAL | 3 refills | Status: DC

## 2017-10-14 NOTE — Progress Notes (Signed)
Patient ID: Deanna Haley, female    DOB: 03-Apr-1946, 72 y.o.   MRN: [id number]  PCP: Harrison Mons, PA-C  Chief Complaint  Patient presents with  . Travel Consult    need medication for flying     Subjective:   Presents for consultation for upcoming travel. She is accompanied by ASL interpreter.  Traveling to [place] with her family. Is afraid of flying. Took something in about 2008, when she flew to [place], which worked well and did not cause adverse effects.  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 Notes that she has gained some weight.  She feels well. No concerns or problems. Tolerates her medications well.  Review of Systems No chest pain, SOB, HA, dizziness, vision change, N/V, diarrhea, constipation, dysuria, urinary urgency or frequency, myalgias, arthralgias or rash.    Patient Active Problem List   Diagnosis Date Noted  . Medication management 08/19/2016  . CKD (chronic kidney disease) stage 3, GFR 30-59 ml/min (HCC) 08/19/2016  . Family history of cancer of genitourinary system 06/04/2016  . Mixed stress and urge urinary incontinence 11/21/2015  . Obesity (BMI 30.0-34.9) 07/08/2015  . Low bone mass 04/21/2015  . Family history of premature coronary artery disease 07/05/2014  . Essential hypertension 04/15/2012  . Hyperlipidemia with target LDL less than 100 04/15/2012  . Deafness 04/15/2012     Prior to Admission medications   Medication Sig Start Date End Date Taking? Authorizing Provider  amLODipine (NORVASC) 2.5 MG tablet Take 1 tablet (2.5 mg total) by mouth daily. 09/15/17  Yes Leonie Man, MD  aspirin 81 MG tablet Take 81 mg by mouth daily.     Yes [provider]  atorvastatin (LIPITOR) 10 MG tablet TAKE 1 TABLET (10 MG TOTAL) BY MOUTH DAILY. 08/14/17  Yes Kolbee Bogusz, PA-C  calcium-vitamin D (OSCAL WITH D) 500-200 MG-UNIT per tablet Take 1 tablet by mouth daily.     Yes [provider]  losartan  (COZAAR) 100 MG tablet TAKE 1 TABLET (100 MG TOTAL) BY MOUTH DAILY. 12/30/16  Yes Harrison Mons, PA-C     Allergies  Allergen Reactions  . Chlorthalidone     dehydration  . Hctz [Hydrochlorothiazide] Other (See Comments)    Rise in Cr, Ca & BUN -- dehydration       Objective:  Physical Exam  Constitutional: She is oriented to person, place, and time. She appears well-developed and well-nourished. She is active and cooperative. No distress.  BP 118/76   Pulse 98   Temp 97.8 F (36.6 C)   Resp 16   Ht 5\' 1"  (1.549 m)   Wt 177 lb 12.8 oz (80.6 kg)   SpO2 97%   BMI 33.60 kg/m    Eyes: Conjunctivae are normal.  Pulmonary/Chest: Effort normal.  Neurological: She is alert and oriented to person, place, and time.  Psychiatric: She has a normal mood and affect. Her speech is normal and behavior is normal.   Wt Readings from Last 3 Encounters:  10/14/17 177 lb 12.8 oz (80.6 kg)  09/05/17 183 lb 12.8 oz (83.4 kg)  06/17/17 180 lb 3.2 oz (81.7 kg)           Assessment & Plan:   Problem List Items Addressed This Visit    Essential hypertension (Chronic)    Continue current regimen of losartan 100 mg and amlodipine 2.5 mg daily.      Relevant Medications   atorvastatin (LIPITOR) 10 MG tablet  losartan (COZAAR) 100 MG tablet   Hyperlipidemia with target LDL less than 100 (Chronic)    Tolerates stain without difficulty. Plan to update labs in 4 months.      Relevant Medications   atorvastatin (LIPITOR) 10 MG tablet   losartan (COZAAR) 100 MG tablet    Other Visit Diagnoses    Fear of flying    -  Primary   Will review her old paper record, as the EMR only dates back to 07/30/2011. Will send in medication previously prescribed.       Return in about 4 months (around 02/13/2018) for re-evaluation of blood pressure and cholesterol.   Fara Chute, PA-C [facility]

## 2017-10-14 NOTE — Assessment & Plan Note (Signed)
Tolerates stain without difficulty. Plan to update labs in 4 months.

## 2017-10-14 NOTE — Patient Instructions (Addendum)
I will get your paper chart, and send the medication to the pharmacy for you.    IF you received an x-ray today, you will receive an invoice from [facility]. Please contact [facility] at [phone] with questions or concerns regarding your invoice.   IF you received labwork today, you will receive an invoice from [facility]. Please contact [facility] at [phone] with questions or concerns regarding your invoice.   Our billing staff will not be able to assist you with questions regarding bills from these companies.  You will be contacted with the lab results as soon as they are available. The fastest way to get your results is to activate your My Chart account. Instructions are located on the last page of this paperwork. If you have not heard from [place] regarding the results in 2 weeks, please contact this office.

## 2017-10-14 NOTE — Assessment & Plan Note (Signed)
Continue current regimen of losartan 100 mg and amlodipine 2.5 mg daily.

## 2017-10-14 NOTE — Telephone Encounter (Signed)
Please let patient know that I have sent in alprazolam (Xanax) for her to take when flying.  Meds ordered this encounter  Medications  . ALPRAZolam (XANAX) 0.5 MG tablet    Sig: Take 0.5-1 tablets (0.25-0.5 mg total) by mouth 3 (three) times daily as needed for anxiety.    Dispense:  10 tablet    Refill:  0    Order Specific Question:   Supervising Provider    Answer:   Brigitte Pulse, EVA N [[id number]]

## 2017-10-15 NOTE — Telephone Encounter (Signed)
Phone call to pharmacy, patient has not picked up medication.   Phone call to patient. Spoke to patient through [place] interpreter. Patient advised of message from provider, is appreciative of call.

## 2017-12-18 ENCOUNTER — Encounter: Payer: Self-pay | Admitting: Gastroenterology

## 2018-02-02 IMAGING — MR MR ABDOMEN WO/W CM
9 of 18 series · 21 of 48 positions shown · IV contrast (multihance)
Comparison: None.

CLINICAL DATA: A ported lead complex renal cysts in the left kidney
on prior ultrasound of 10/04/2016.

EXAM:
MRI ABDOMEN WITHOUT AND WITH CONTRAST
TECHNIQUE: Multiplanar multisequence MR imaging of the abdomen was performed
both before and after the administration of intravenous contrast.
CONTRAST:  9mL MULTIHANCE GADOBENATE DIMEGLUMINE 529 MG/ML IV SOLN

[Series 3: DWI b500 · axial · 6.0mm · 1.76mm/px · z∈[-67,+221]mm · 3 of 76 slices shown]
[im 1/76]
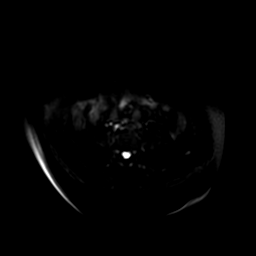
[im 38/76]
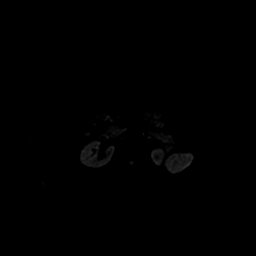
[im 76/76]
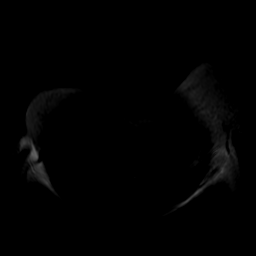

[Series 4: T2 fat-sat · axial · 5.0mm · 0.78mm/px · z∈[-70,+220]mm · 2 of 59 slices shown]
[im 1/59]
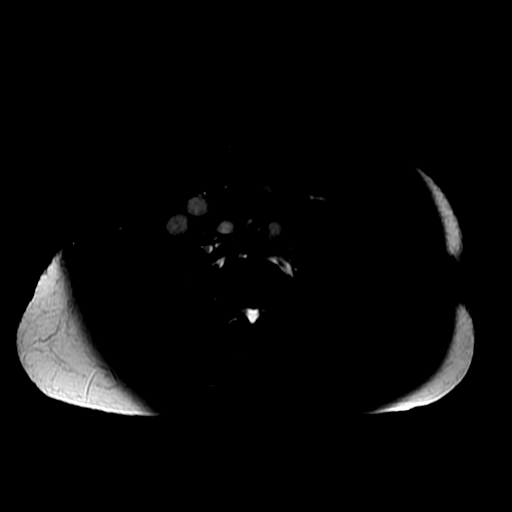
[im 59/59]
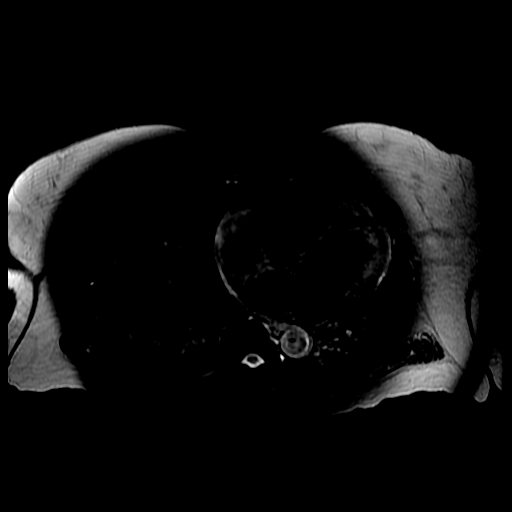

[Series 5: ax dualecho · axial · 5.0mm · 0.78mm/px · z∈[-70,+220]mm · 4 of 118 slices shown]
[im 1/118]
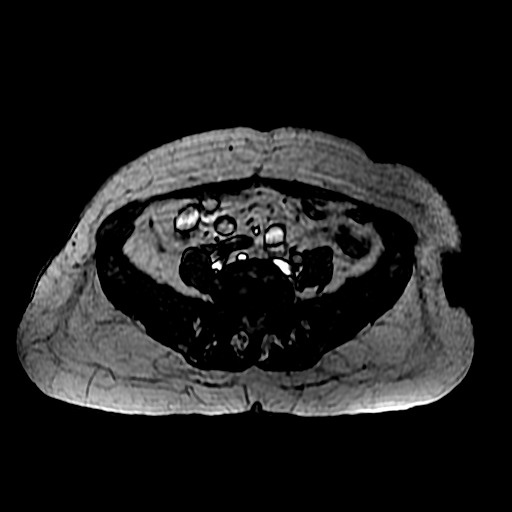
[im 40/118]
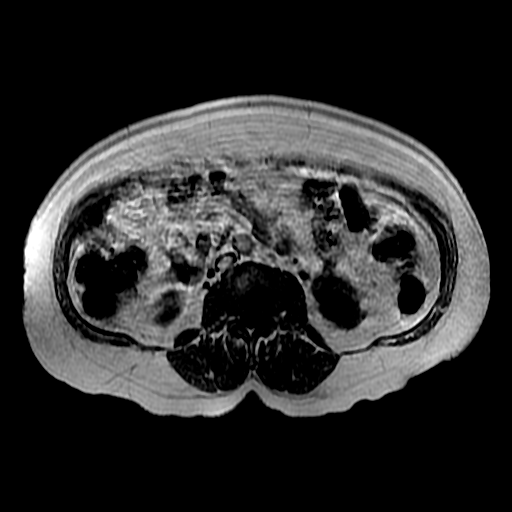
[im 79/118]
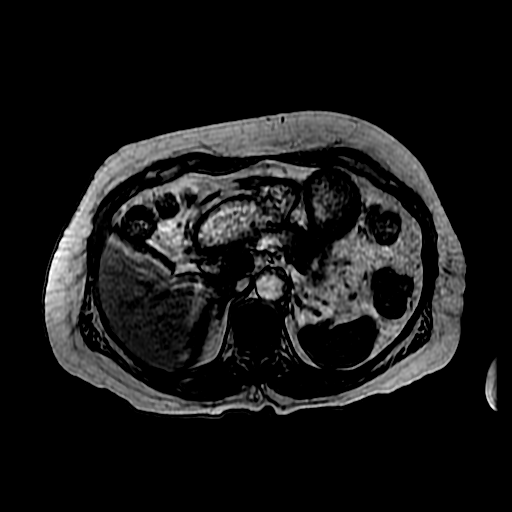
[im 118/118]
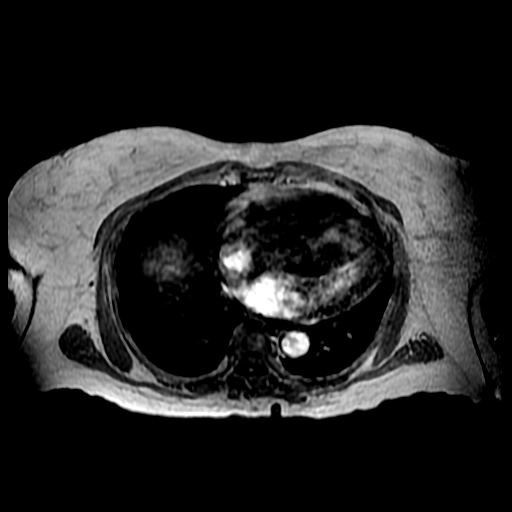

[Series 6: T2 · axial · 5.0mm · 0.78mm/px · z∈[-70,+220]mm · 2 of 59 slices shown (1 of 2)]
[im 1/59]
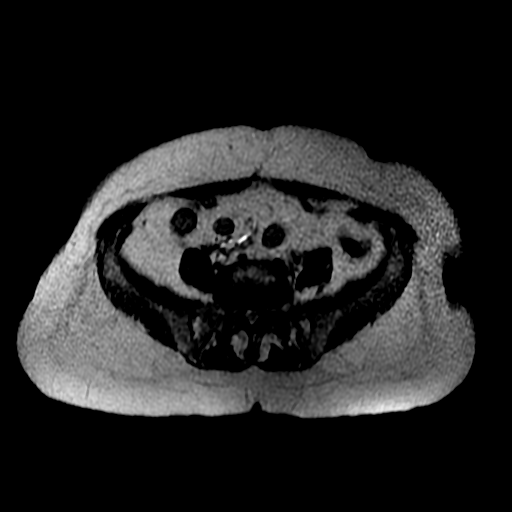
[im 59/59]
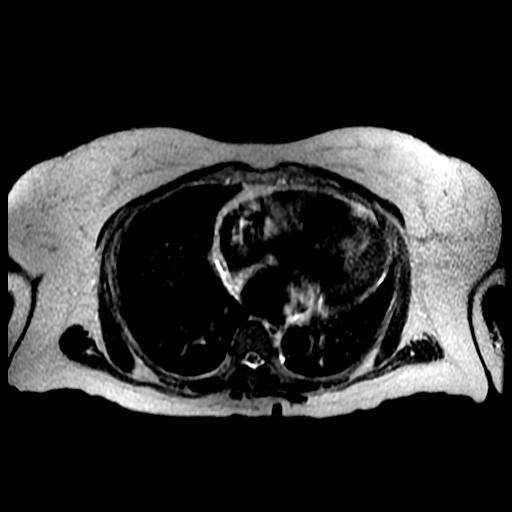

[Series 7: T2 · coronal · 5.0mm · 0.78mm/px · 1 of 40 slices shown (2 of 2)]
[im 1/40]
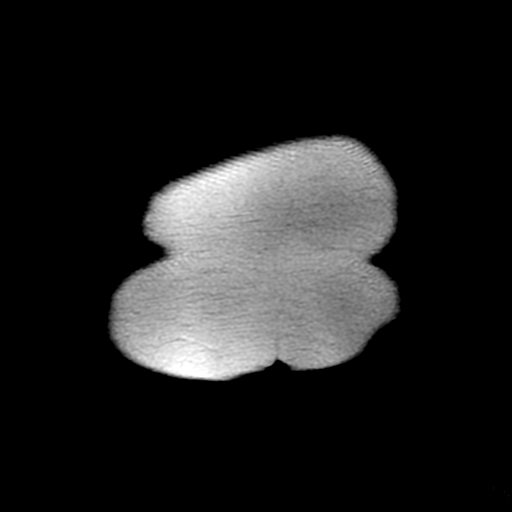

[Series 8: bSSFP · axial · 5.0mm · 0.78mm/px · z∈[-70,+220]mm · 2 of 59 slices shown]
[im 1/59]
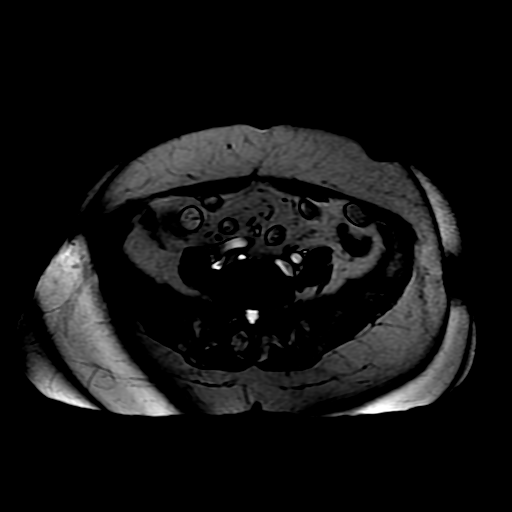
[im 59/59]
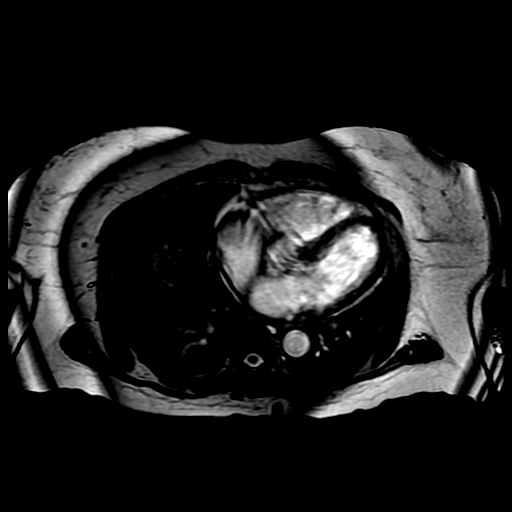

[Series 300: DWI · axial · 6.0mm · 1.76mm/px · 1 of 38 slices shown]
[im 1/38]
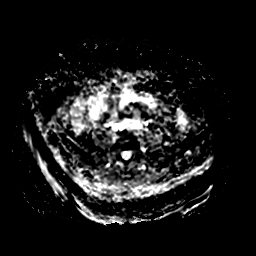

[Series 900: T1 dynamic · axial · 5.6mm · 0.86mm/px · z∈[-59,+184]mm · 3 of 88 slices shown (1 of 2)]
[im 1/88]
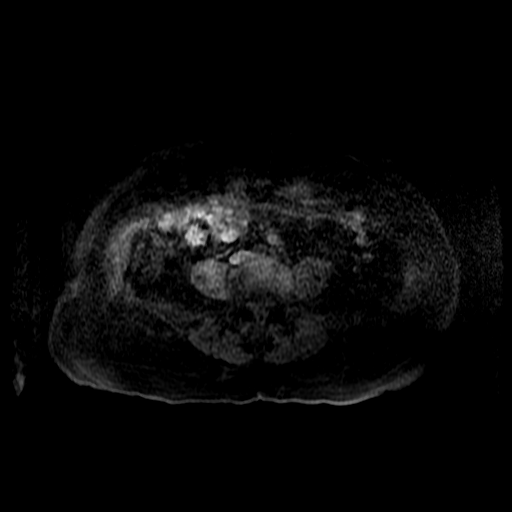
[im 44/88]
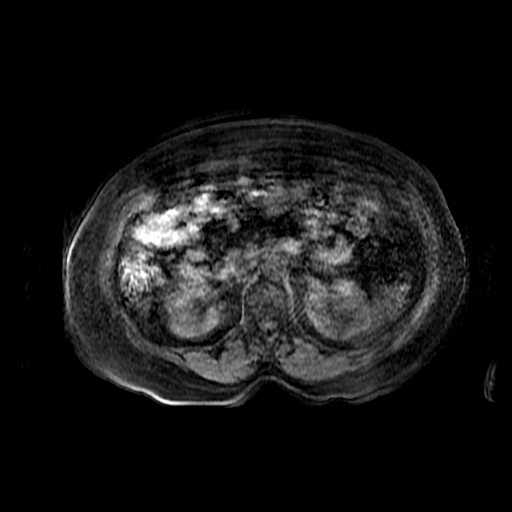
[im 88/88]
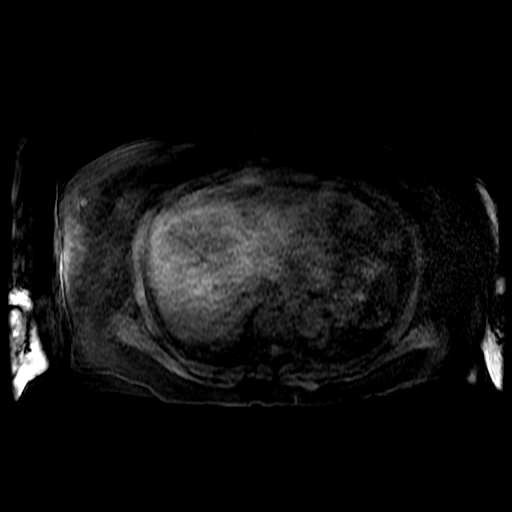

[Series 901: T1 dynamic · axial · 5.6mm · 0.86mm/px · z∈[-59,+184]mm · 3 of 88 slices shown (2 of 2)]
[im 1/88]
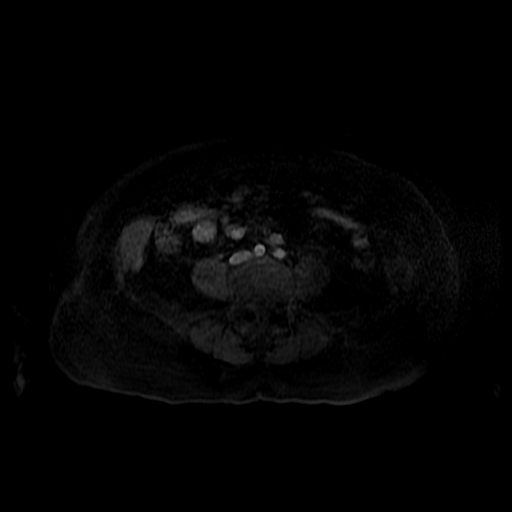
[im 44/88]
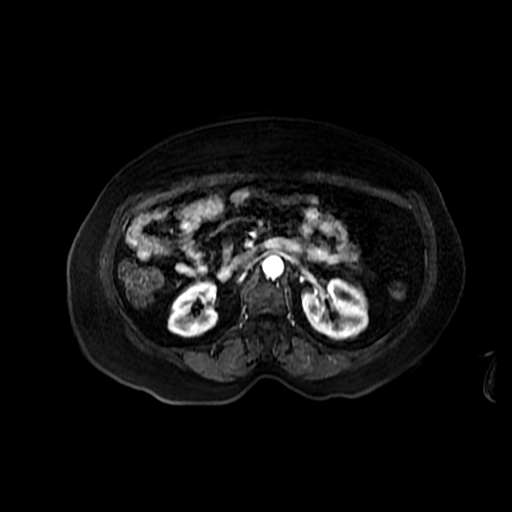
[im 88/88]
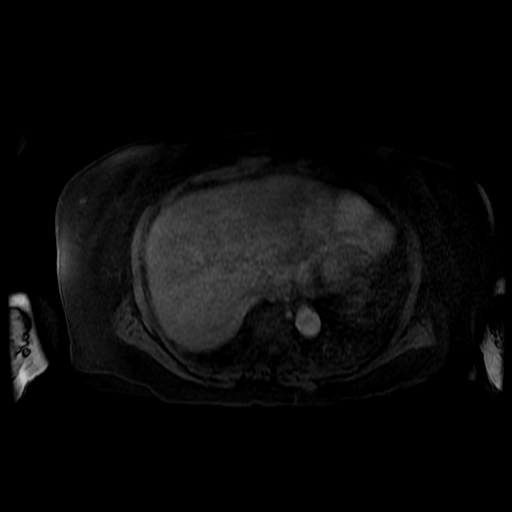

[21 of 48 positions shown; findings below may reference images not displayed]

FINDINGS: Despite efforts by the technologist and patient, motion artifact is
present on today's exam and could not be eliminated. This reduces
exam sensitivity and specificity.

Lower chest: Mild cardiomegaly.

Hepatobiliary: 7 by 6 mm T2 hyperintense lesion in segment 2 of the
liver, image [DATE], demonstrates T1 hypointensity and no definite
enhancement although the lesion size combined with motion artifact
make post-contrast assessment problematic. I favor this being a
small cyst.

Gallbladder appears unremarkable.

Pancreas:  Unremarkable

Spleen:  Unremarkable

Adrenals/Urinary Tract: Adrenal glands normal. No cyst or abnormal
renal parenchymal lesion is seen in either kidney, with special
attention directed at the left mid kidney where there is some focal
hypoechogenicity on sonography. I speculated that this appearance
may have been due to a medullary pyramid simulating an underlying
lesion.

Stomach/Bowel: Unremarkable

Vascular/Lymphatic:  Unremarkable

Other:  No supplemental non-categorized findings.

Musculoskeletal: Disc desiccation at all levels between L2 and S1
with degenerative endplate findings at the L5-S1 level.
IMPRESSION: 1. No renal lesion is identified. The left mid kidney finding at
sonography is likely due to is somewhat prominent medullary pyramid
simulating a lesion.
2. Lumbar spondylosis and degenerative disc disease.
3. 6 by 7 mm lesion in segment 2 of the liver is technically too
small to characterize although statistically likely to be a small
cyst.

## 2018-03-11 ENCOUNTER — Encounter: Payer: Self-pay | Admitting: Gastroenterology

## 2018-03-30 ENCOUNTER — Other Ambulatory Visit: Payer: Self-pay

## 2018-03-30 ENCOUNTER — Ambulatory Visit (AMBULATORY_SURGERY_CENTER): Payer: Self-pay | Admitting: *Deleted

## 2018-03-30 ENCOUNTER — Encounter: Payer: Self-pay | Admitting: Gastroenterology

## 2018-03-30 VITALS — Ht 61.0 in | Wt 177.0 lb

## 2018-03-30 DIAGNOSIS — Z86010 Personal history of colonic polyps: Secondary | ICD-10-CM

## 2018-03-30 MED ORDER — PLENVU 140 G PO SOLR: 1.0000 | Freq: Once | ORAL | 0 refills | Status: AC

## 2018-03-30 NOTE — Progress Notes (Signed)
No egg or soy allergy known to patient  No issues with past sedation with any surgeries  or procedures, no intubation problems  No diet pills per patient No home 02 use per patient  No blood thinners per patient  Pt denies issues with constipation  No A fib or A flutter  EMMI video sent to pt's e mail  Plenvu sample given, Lot # [id number], exp 12/2018

## 2018-04-13 ENCOUNTER — Ambulatory Visit (AMBULATORY_SURGERY_CENTER): Payer: Medicare Other | Admitting: Gastroenterology

## 2018-04-13 ENCOUNTER — Encounter: Payer: Self-pay | Admitting: Gastroenterology

## 2018-04-13 VITALS — BP 122/75 | HR 78 | Temp 99.5°F | Resp 12 | Ht 61.0 in | Wt 177.0 lb

## 2018-04-13 DIAGNOSIS — D125 Benign neoplasm of sigmoid colon: Secondary | ICD-10-CM | POA: Diagnosis not present

## 2018-04-13 DIAGNOSIS — Z86010 Personal history of colonic polyps: Secondary | ICD-10-CM

## 2018-04-13 MED ORDER — SODIUM CHLORIDE 0.9 % IV SOLN: 500.0000 mL | Freq: Once | INTRAVENOUS | Status: DC

## 2018-04-13 NOTE — Progress Notes (Signed)
Called to room to assist during endoscopic procedure.  Patient ID and intended procedure confirmed with present staff. Received instructions for my participation in the procedure from the performing physician.  

## 2018-04-13 NOTE — Op Note (Signed)
[facility] Patient Name: Deanna Haley Procedure Date: 04/13/2018 4:16 PM MRN: [id number] Endoscopist: Mallie Mussel L. Loletha Carrow , MD Age: 72 Referring MD:  Date of Birth: 03/12/1946 Gender: Female Account #: [id number] Procedure:                Colonoscopy Indications:              Screening for colorectal malignant neoplasm                            (sessile serrated polyp 2007; no polyps 12/2010 but                            poor preparation) Medicines:                Monitored Anesthesia Care Procedure:                Pre-Anesthesia Assessment:                           - Prior to the procedure, a History and Physical                            was performed, and patient medications and                            allergies were reviewed. The patient's tolerance of                            previous anesthesia was also reviewed. The risks                            and benefits of the procedure and the sedation                            options and risks were discussed with the patient.                            All questions were answered, and informed consent                            was obtained. Anticoagulants: The patient has taken                            aspirin. It was decided not to withhold this                            medication prior to the procedure. ASA Grade                            Assessment: II - A patient with mild systemic                            disease. After reviewing the risks and benefits,  the patient was deemed in satisfactory condition to                            undergo the procedure.                           After obtaining informed consent, the colonoscope                            was passed under direct vision. Throughout the                            procedure, the patient's blood pressure, pulse, and                            oxygen saturations were monitored continuously. The            Model PCF-[id number] scope was introduced                            through the anus and advanced to the the cecum,                            identified by appendiceal orifice and ileocecal                            valve. The colonoscopy was performed without                            difficulty. The patient tolerated the procedure                            well. The quality of the bowel preparation was                            excellent. The ileocecal valve, appendiceal                            orifice, and rectum were photographed. The bowel                            preparation used was Plenvu. Scope In: 4:24:54 PM Scope Out: 4:37:52 PM Scope Withdrawal Time: 0 hours 8 minutes 47 seconds  Total Procedure Duration: 0 hours 12 minutes 58 seconds  Findings:                 The perianal and digital rectal examinations were                            normal.                           A 3 mm polyp was found in the sigmoid colon. The                            polyp  was sessile. The polyp was removed with a                            cold snare. Resection and retrieval were complete.                           The exam was otherwise without abnormality on                            direct and retroflexion views. Complications:            No immediate complications. Estimated Blood Loss:     Estimated blood loss was minimal. Impression:               - One 3 mm polyp in the sigmoid colon, removed with                            a cold snare. Resected and retrieved.                           - The examination was otherwise normal on direct                            and retroflexion views. Recommendation:           - Patient has a contact number available for                            emergencies. The signs and symptoms of potential                            delayed complications were discussed with the                            patient. Return to normal  activities tomorrow.                            Written discharge instructions were provided to the                            patient.                           - Resume previous diet.                           - Continue present medications.                           - Await pathology results.                           - Repeat colonoscopy is recommended for                            surveillance. The colonoscopy date will be  determined after pathology results from today's                            exam become available for review. Shulamis Wenberg L. Loletha Carrow, MD 04/13/2018 4:42:44 PM This report has been signed electronically.

## 2018-04-13 NOTE — Patient Instructions (Signed)
YOU HAD AN ENDOSCOPIC PROCEDURE TODAY AT THE [facility]:   Refer to the procedure report that was given to you for any specific questions about what was found during the examination.  If the procedure report does not answer your questions, please call your gastroenterologist to clarify.  If you requested that your care partner not be given the details of your procedure findings, then the procedure report has been included in a sealed envelope for you to review at your convenience later.  YOU SHOULD EXPECT: Some feelings of bloating in the abdomen. Passage of more gas than usual.  Walking can help get rid of the air that was put into your GI tract during the procedure and reduce the bloating. If you had a lower endoscopy (such as a colonoscopy or flexible sigmoidoscopy) you may notice spotting of blood in your stool or on the toilet paper. If you underwent a bowel prep for your procedure, you may not have a normal bowel movement for a few days.  Please Note:  You might notice some irritation and congestion in your nose or some drainage.  This is from the oxygen used during your procedure.  There is no need for concern and it should clear up in a day or so.  SYMPTOMS TO REPORT IMMEDIATELY:   Following lower endoscopy (colonoscopy or flexible sigmoidoscopy):  Excessive amounts of blood in the stool  Significant tenderness or worsening of abdominal pains  Swelling of the abdomen that is new, acute  Fever of 100F or higher  For urgent or emergent issues, a gastroenterologist can be reached at any hour by calling [phone].   DIET:  We do recommend a small meal at first, but then you may proceed to your regular diet.  Drink plenty of fluids but you should avoid alcoholic beverages for 24 hours.  ACTIVITY:  You should plan to take it easy for the rest of today and you should NOT DRIVE or use heavy machinery until tomorrow (because of the sedation medicines used during the test).     FOLLOW UP: Our staff will call the number listed on your records the next business day following your procedure to check on you and address any questions or concerns that you may have regarding the information given to you following your procedure. If we do not reach you, we will leave a message.  However, if you are feeling well and you are not experiencing any problems, there is no need to return our call.  We will assume that you have returned to your regular daily activities without incident.  If any biopsies were taken you will be contacted by phone or by letter within the next 1-3 weeks.  Please call us at [phone] if you have not heard about the biopsies in 3 weeks.    SIGNATURES/CONFIDENTIALITY: You and/or your care partner have signed paperwork which will be entered into your electronic medical record.  These signatures attest to the fact that that the information above on your After Visit Summary has been reviewed and is understood.  Full responsibility of the confidentiality of this discharge information lies with you and/or your care-partner. 

## 2018-04-13 NOTE — Progress Notes (Signed)
To PACU, vss patent aw report to rn 

## 2018-04-13 NOTE — Progress Notes (Signed)
Interpreter used today at the [employer] for this pt.'s family member.  Interpreter's name is- Britt Bolognese.

## 2018-04-14 ENCOUNTER — Telehealth: Payer: Self-pay | Admitting: *Deleted

## 2018-04-14 NOTE — Telephone Encounter (Signed)
  Follow up Call-  Call back number 04/13/2018  Post procedure Call Back phone  # [phone]  Some recent data might be hidden     Patient questions:  Do you have a fever, pain , or abdominal swelling? No. Pain Score  0 *  Have you tolerated food without any problems? Yes.    Have you been able to return to your normal activities? Yes.    Do you have any questions about your discharge instructions: Diet   No. Medications  No. Follow up visit  No.  Do you have questions or concerns about your Care? No.  Actions: * If pain score is 4 or above: No action needed, pain <4.

## 2018-04-21 ENCOUNTER — Encounter: Payer: Self-pay | Admitting: Gastroenterology

## 2018-05-06 ENCOUNTER — Other Ambulatory Visit: Payer: Self-pay | Admitting: Physician Assistant

## 2018-05-06 DIAGNOSIS — Z1231 Encounter for screening mammogram for malignant neoplasm of breast: Secondary | ICD-10-CM

## 2018-05-07 ENCOUNTER — Ambulatory Visit: Payer: Medicare Other

## 2018-06-22 ENCOUNTER — Ambulatory Visit
Admission: RE | Admit: 2018-06-22 | Discharge: 2018-06-22 | Disposition: A | Discharge status: Home / Self Care | Payer: Medicare Other | Source: Ambulatory Visit | Attending: Physician Assistant | Admitting: Physician Assistant

## 2018-06-22 DIAGNOSIS — Z1231 Encounter for screening mammogram for malignant neoplasm of breast: Secondary | ICD-10-CM

## 2018-10-26 ENCOUNTER — Other Ambulatory Visit: Payer: Self-pay | Admitting: Cardiology

## 2019-01-30 DIAGNOSIS — G4733 Obstructive sleep apnea (adult) (pediatric): Secondary | ICD-10-CM

## 2019-01-30 HISTORY — DX: Obstructive sleep apnea (adult) (pediatric): G47.33

## 2019-02-02 ENCOUNTER — Other Ambulatory Visit: Payer: Self-pay | Admitting: Cardiology

## 2019-02-02 DIAGNOSIS — E785 Hyperlipidemia, unspecified: Secondary | ICD-10-CM

## 2019-02-16 ENCOUNTER — Other Ambulatory Visit: Payer: Self-pay

## 2019-02-16 DIAGNOSIS — Z20822 Contact with and (suspected) exposure to covid-19: Secondary | ICD-10-CM

## 2019-02-17 ENCOUNTER — Telehealth: Payer: Self-pay | Admitting: *Deleted

## 2019-02-17 DIAGNOSIS — E785 Hyperlipidemia, unspecified: Secondary | ICD-10-CM

## 2019-02-17 DIAGNOSIS — Z8249 Family history of ischemic heart disease and other diseases of the circulatory system: Secondary | ICD-10-CM

## 2019-02-17 DIAGNOSIS — Z79899 Other long term (current) drug therapy: Secondary | ICD-10-CM

## 2019-02-17 LAB — NOVEL CORONAVIRUS, NAA: SARS-CoV-2, NAA: NOT DETECTED

## 2019-02-17 NOTE — Telephone Encounter (Signed)
Mailed labslip for upcoming  virtual visit

## 2019-02-18 ENCOUNTER — Telehealth: Payer: Self-pay | Admitting: Physician Assistant

## 2019-02-18 NOTE — Telephone Encounter (Signed)
Pt aware covid lab test negative, not detected °

## 2019-02-24 LAB — LIPID PANEL
Chol/HDL Ratio: 2.4 ratio (ref 0.0–4.4)
Cholesterol, Total: 198 mg/dL (ref 100–199)
HDL: 82 mg/dL (ref >39)
LDL Calculated: 101 mg/dL — ABNORMAL HIGH (ref 0–99)
Triglycerides: 76 mg/dL (ref 0–149)
VLDL Cholesterol Cal: 15 mg/dL (ref 5–40)

## 2019-02-24 LAB — COMPREHENSIVE METABOLIC PANEL
ALT: 14 IU/L (ref 0–32)
AST: 17 IU/L (ref 0–40)
Albumin/Globulin Ratio: 1.4 (ref 1.2–2.2)
Albumin: 4.4 g/dL (ref 3.7–4.7)
Alkaline Phosphatase: 118 IU/L — ABNORMAL HIGH (ref 39–117)
BUN/Creatinine Ratio: 18 (ref 12–28)
BUN: 19 mg/dL (ref 8–27)
Bilirubin Total: <0.2 mg/dL (ref 0.0–1.2)
CO2: 22 mmol/L (ref 20–29)
Calcium: 10.3 mg/dL (ref 8.7–10.3)
Chloride: 106 mmol/L (ref 96–106)
Creatinine, Ser: 1.03 mg/dL — ABNORMAL HIGH (ref 0.57–1.00)
GFR calc Af Amer: 63 mL/min/{1.73_m2} (ref >59)
GFR calc non Af Amer: 54 mL/min/{1.73_m2} — ABNORMAL LOW (ref >59)
Globulin, Total: 3.2 g/dL (ref 1.5–4.5)
Glucose: 85 mg/dL (ref 65–99)
Potassium: 4.4 mmol/L (ref 3.5–5.2)
Sodium: 139 mmol/L (ref 134–144)
Total Protein: 7.6 g/dL (ref 6.0–8.5)

## 2019-03-02 ENCOUNTER — Encounter: Payer: Self-pay | Admitting: Cardiology

## 2019-03-02 ENCOUNTER — Telehealth (INDEPENDENT_AMBULATORY_CARE_PROVIDER_SITE_OTHER): Payer: Medicare Other | Admitting: Cardiology

## 2019-03-02 ENCOUNTER — Telehealth: Payer: Self-pay | Admitting: *Deleted

## 2019-03-02 VITALS — BP 140/80 | HR 80 | Ht 61.0 in | Wt 179.0 lb

## 2019-03-02 DIAGNOSIS — E785 Hyperlipidemia, unspecified: Secondary | ICD-10-CM | POA: Diagnosis not present

## 2019-03-02 DIAGNOSIS — Z8249 Family history of ischemic heart disease and other diseases of the circulatory system: Secondary | ICD-10-CM | POA: Diagnosis not present

## 2019-03-02 DIAGNOSIS — E669 Obesity, unspecified: Secondary | ICD-10-CM | POA: Diagnosis not present

## 2019-03-02 DIAGNOSIS — I10 Essential (primary) hypertension: Secondary | ICD-10-CM

## 2019-03-02 MED ORDER — LOSARTAN POTASSIUM 100 MG PO TABS: 100.0000 mg | ORAL_TABLET | Freq: Every day | ORAL | 3 refills | Status: DC

## 2019-03-02 MED ORDER — AMLODIPINE BESYLATE 2.5 MG PO TABS: 2.5000 mg | ORAL_TABLET | Freq: Every day | ORAL | 3 refills | Status: DC

## 2019-03-02 MED ORDER — ATORVASTATIN CALCIUM 10 MG PO TABS: 10.0000 mg | ORAL_TABLET | Freq: Every day | ORAL | 3 refills | Status: DC

## 2019-03-02 NOTE — Progress Notes (Signed)
Virtual Visit via Telephone Note   This visit type was conducted due to national recommendations for restrictions regarding the COVID-19 Pandemic (e.g. social distancing) in an effort to limit this patient's exposure and mitigate transmission in our community.  Due to her co-morbid illnesses, this patient is at least at moderate risk for complications without adequate follow up.  This format is felt to be most appropriate for this patient at this time.  The patient did not have access to video technology/had technical difficulties with video requiring transitioning to audio format only (telephone).  All issues noted in this document were discussed and addressed.  No physical exam could be performed with this format.  Please refer to the patient's chart for her  consent to telehealth for [facility].   Due to the necessity of having third-party sign language communication, we were unable to establish three-way video conferencing with the patient's technical capacity.  Therefore physician to patient medication was via telephone in order to allow video communication between the sign language interpreter and the patient.  Patient has given verbal permission to conduct this visit via virtual appointment and to bill insurance 03/02/2019 11:31 AM     Evaluation Performed:  Follow-up visit  Date:  03/02/2019   ID:  Deanna Haley, DOB 28-Nov-1945, MRN [id number]  Patient Location: Home Provider Location: Other:  Hospital office  PCP:  Harrison Mons, [place]  Cardiologist:   Glenetta Hew, MD Electrophysiologist:  None   Chief Complaint:  Delayed annual follow-up  History of Present Illness:    Deanna Haley is a 73 y.o. legally deaf female with PMH notable for cardiac risk factors of hypertension and hyperlipidemia with significant family history of CAD (negative cardiology work-up today) who presents via audio/video conferencing for a telehealth visit today.  Deanna Haley was last seen  in March 2019.  Was doing well.  Just returned from a cruise to [place] and was getting ready go to [place].  Was stable from a cardiac standpoint.  Only noting some mild stable arthritis.  But otherwise was doing well.  Interval History:  Overall, doing well.  Staying relatively active - has been more lazy with COVID - sitting around a lot.    Cardiovascular ROS: no chest pain or dyspnea on exertion positive for - OSA negative for - edema, irregular heartbeat, orthopnea, palpitations, paroxysmal nocturnal dyspnea, rapid heart rate, shortness of breath or syncope/near syncope, TIA/amaurosis fugax.    Was recently Dx'd with OSA - is working on getting set up for CPAP (this Friday) - had not been sleeping well, wake up sweating.   The patient does not have symptoms concerning for COVID-19 infection (fever, chills, cough, or new shortness of breath).  The patient is practicing social distancing. Went to [place] on [employer] day -- Had a Negative COVID test.  ROS:  Please see the history of present illness.    Review of Systems  Constitutional: Negative for malaise/fatigue (just being lazy with COVID) and weight loss.  HENT: Negative for congestion and nosebleeds.   Respiratory: Negative for cough and shortness of breath.   Gastrointestinal: Negative for blood in stool, melena, nausea and vomiting.  Genitourinary: Negative for hematuria.  Musculoskeletal: Negative for falls and joint pain.  Psychiatric/Behavioral:       Night sweats    Past Medical History:  Diagnosis Date  . Arthritis   . Deaf   . Essential hypertension 04/15/2012   Evaluated at [facility]- Aug 2010 with Dr. Madison Hickman  Elisabeth Cara   . Hyperlipidemia with target LDL less than 100 04/15/2012  . OSA on CPAP 01/2019   Due to Start CPAP 03/2019   Past Surgical History:  Procedure Laterality Date  . ABDOMINAL HYSTERECTOMY     1994  . COLONOSCOPY    . DOPPLER ECHOCARDIOGRAPHY  08/30/2010   left  ventricle is normal,EF= >55%  . MANDIBLE SURGERY  1986   per patient jaws lockup  . NM MYOCAR PERF WALL MOTION  08/29/2010   POST STRESS- EF 77% NO SIGNIFICANT WALL MOTION ABNORMALITIES,EXERCISE CAPCITY 7 METS ,LOW RISK SCAN  . POLYPECTOMY       Current Meds  Medication Sig  . ALPRAZolam (XANAX) 0.5 MG tablet Take 0.5-1 tablets (0.25-0.5 mg total) by mouth 3 (three) times daily as needed for anxiety.  [name] amLODipine (NORVASC) 2.5 MG tablet Take 1 tablet (2.5 mg total) by mouth daily.  [name] aspirin 81 MG tablet Take 81 mg by mouth daily.    [name] atorvastatin (LIPITOR) 10 MG tablet Take 1 tablet (10 mg total) by mouth daily.  . calcium carbonate (OS-CAL) 600 MG TABS tablet Take 600 mg by mouth daily with breakfast.  . Cholecalciferol (VITAMIN D-3) 25 MCG (1000 UT) CAPS Take by mouth.  . losartan (COZAAR) 100 MG tablet Take 1 tablet (100 mg total) by mouth daily.  . [DISCONTINUED] amLODipine (NORVASC) 2.5 MG tablet TAKE 1 TABLET BY MOUTH EVERY DAY  . [DISCONTINUED] atorvastatin (LIPITOR) 10 MG tablet TAKE 1 TABLET BY MOUTH EVERY DAY  . [DISCONTINUED] losartan (COZAAR) 100 MG tablet Take 1 tablet (100 mg total) by mouth daily.     Allergies:   Patient has no active allergies.   Social History   Tobacco Use  . Smoking status: Never Smoker  . Smokeless tobacco: Never Used  Substance Use Topics  . Alcohol use: Yes    Alcohol/week: 5.0 standard drinks    Types: 5 Glasses of wine per week  . Drug use: No     Family Hx: The patient's family history includes Cancer in her brother, brother, and sister; Diabetes in her brother and sister; Heart attack in her father, mother, and sister; Lupus in her sister; Stroke in her brother. There is no history of Anemia, Colon cancer, Colon polyps, Esophageal cancer, Stomach cancer, or Rectal cancer.   Prior CV studies:   The following studies were reviewed today: . No new studies:  Labs/Other Tests and Data Reviewed:    EKG:  No ECG reviewed.  Recent  Labs: 02/23/2019: ALT 14; BUN 19; Creatinine, Ser 1.03; Potassium 4.4; Sodium 139   Recent Lipid Panel Lab Results  Component Value Date   CHOL 198 02/23/2019   HDL 82 02/23/2019   LDLCALC 101 (H) 02/23/2019   LDLDIRECT 87 10/27/2014   TRIG 76 02/23/2019   CHOLHDL 2.4 02/23/2019    Wt Readings from Last 3 Encounters:  03/02/19 179 lb (81.2 kg)  04/13/18 177 lb (80.3 kg)  03/30/18 177 lb (80.3 kg)     Objective:    Vital Signs:  BP 140/80 (BP Location: Left Arm)   Pulse 80   Ht 5\' 1"  (1.549 m)   Wt 179 lb (81.2 kg)   BMI 33.82 kg/m  - usually lower VITAL SIGNS:  reviewed Unable to perform any visual or auditory exam because the patient is deaf and using a sign language interpreter. Seems to be in good mood  ASSESSMENT & PLAN:    Problem List Items Addressed This Visit  Obesity (BMI 30.0-34.9) - Primary (Chronic)   Relevant Orders   Lipid panel   Comprehensive metabolic panel   Hyperlipidemia with target LDL less than 100 (Chronic)   Relevant Medications   losartan (COZAAR) 100 MG tablet   atorvastatin (LIPITOR) 10 MG tablet   amLODipine (NORVASC) 2.5 MG tablet   Other Relevant Orders   Lipid panel   Comprehensive metabolic panel   Family history of premature coronary artery disease (Chronic)   Essential hypertension (Chronic)   Relevant Medications   losartan (COZAAR) 100 MG tablet   atorvastatin (LIPITOR) 10 MG tablet   amLODipine (NORVASC) 2.5 MG tablet   Other Relevant Orders   Comprehensive metabolic panel     Presley is doing very well.  Her lipids are pretty much at goal as is her blood pressure.  Her weight is up a little bit, but she acknowledges having been a little more lazy during the COVID-19 restriction timeframe.  Plan: Overall no changes.  We will refill her current medications.    Recheck lipids and chemistry panel prior to annual follow-up.  I did recommend that she tries to work on increasing her exercise level to try to lose weight.   Goal will be to lose 10 pounds by next visit.  She is now starting on CPAP hopefully as of this weekend.  This should help her blood pressure and perhaps give her more energy levels during the day to better get more exercise.  Follow-up in 1 year   COVID-19 Education: The signs and symptoms of COVID-19 were discussed with the patient and how to seek care for testing (follow up with PCP or arrange E-visit).   The importance of social distancing was discussed today.  Time:   Today, I have spent 15 minutes with the patient with telehealth technology discussing the above problems.     Medication Adjustments/Labs and Tests Ordered: Current medicines are reviewed at length with the patient today.  Concerns regarding medicines are outlined above.  Medication Instructions: no changes  Tests Ordered: Orders Placed This Encounter  Procedures  . Lipid panel  . Comprehensive metabolic panel    Medication Changes: Meds ordered this encounter  Medications  . losartan (COZAAR) 100 MG tablet    Sig: Take 1 tablet (100 mg total) by mouth daily.    Dispense:  90 tablet    Refill:  3  . atorvastatin (LIPITOR) 10 MG tablet    Sig: Take 1 tablet (10 mg total) by mouth daily.    Dispense:  90 tablet    Refill:  3  . amLODipine (NORVASC) 2.5 MG tablet    Sig: Take 1 tablet (2.5 mg total) by mouth daily.    Dispense:  90 tablet    Refill:  3    Disposition:  Follow up in 1 year(s)    Signed, Glenetta Hew, MD  03/02/2019 11:31 AM    [place]

## 2019-03-02 NOTE — Patient Instructions (Addendum)
Medication Instructions:   NO changes -  current prescriptions REFILLED  Goal is to do more exercise (more walking, etc) -- try to loose ~ 10 lb by next visit.  If you need a refill on your cardiac medications before your next appointment, please call your pharmacy.   Lab work:  -- Will need Lipids & CMP before annual f/u next year WILL MAIL [facility] @ July OR AUG 2021 PRIOR TO ANNUAL APPT  If you have labs (blood work) drawn today and your tests are completely normal, you will receive your results only by: [name] MyChart Message (if you have MyChart) OR . A paper copy in the mail If you have any lab test that is abnormal or we need to change your treatment, we will call you to review the results.  Testing/Procedures: none  Follow-Up: At [facility], you and your health needs are our priority.  As part of our continuing mission to provide you with exceptional heart care, we have created designated Provider Care Teams.  These Care Teams include your primary Cardiologist (physician) and Advanced Practice Providers (APPs -  Physician Assistants and Nurse Practitioners) who all work together to provide you with the care you need, when you need it. . You will need a follow up appointment in   12  Months SEPT 2021.  Please call our office 2 months in advance to schedule this appointment.  You may see Glenetta Hew, MD or one of the following Advanced Practice Providers on your designated Care Team:   . Rosaria Ferries, PA-C . Jory Sims, DNP, ANP  Any Other Special Instructions Will Be Listed Below .

## 2019-03-02 NOTE — Telephone Encounter (Signed)
Left a message  TO PATIENT through Video interpreter ( deaf )  . INSTRUCTION WAS GIVEN FROM TODAY'S VIRTUAL VISIT.  AVS SUMMARY IS MAILED.  PATIENT may call back if in question in regards to message .

## 2019-06-15 ENCOUNTER — Other Ambulatory Visit: Payer: Self-pay | Admitting: Physician Assistant

## 2019-06-15 DIAGNOSIS — Z1231 Encounter for screening mammogram for malignant neoplasm of breast: Secondary | ICD-10-CM

## 2019-07-30 ENCOUNTER — Other Ambulatory Visit: Payer: Self-pay

## 2019-07-30 ENCOUNTER — Ambulatory Visit (HOSPITAL_COMMUNITY)
Admission: EM | Admit: 2019-07-30 | Discharge: 2019-07-30 | Disposition: A | Discharge status: Home / Self Care | Payer: Medicare PPO | Attending: Emergency Medicine | Admitting: Emergency Medicine

## 2019-07-30 ENCOUNTER — Encounter (HOSPITAL_COMMUNITY): Payer: Self-pay

## 2019-07-30 DIAGNOSIS — N898 Other specified noninflammatory disorders of vagina: Secondary | ICD-10-CM | POA: Diagnosis present

## 2019-07-30 DIAGNOSIS — N3000 Acute cystitis without hematuria: Secondary | ICD-10-CM | POA: Diagnosis present

## 2019-07-30 LAB — POCT URINALYSIS DIP (DEVICE)
Bilirubin Urine: NEGATIVE
Glucose, UA: NEGATIVE mg/dL
Ketones, ur: NEGATIVE mg/dL
Nitrite: NEGATIVE
Protein, ur: NEGATIVE mg/dL
Specific Gravity, Urine: <=1.005 (ref 1.005–1.030)
Urobilinogen, UA: 0.2 mg/dL (ref 0.0–1.0)
pH: 5.5 (ref 5.0–8.0)

## 2019-07-30 MED ORDER — FLUCONAZOLE 150 MG PO TABS: ORAL_TABLET | ORAL | 0 refills | Status: DC

## 2019-07-30 MED ORDER — CEPHALEXIN 500 MG PO CAPS: 500.0000 mg | ORAL_CAPSULE | Freq: Two times a day (BID) | ORAL | 0 refills | Status: AC

## 2019-07-30 NOTE — ED Provider Notes (Signed)
[place]    CSN: [id number] Arrival date & time: 07/30/19  0856      History   Chief Complaint Chief Complaint  Patient presents with  . Vaginitis    HPI Deanna Haley is a 74 y.o. female.   Deanna Haley presents with complaints of vulvar itching, burning and odor with white vaginal discharge. No specific urinary frequency but does note some burning with urination. Feels similar to yeast infection and UTI's she has had in the past. No other abdominal pain, no back pain. She is not diabetic. No fevers. Started last night. Took ibuprofen which did help some. She is no longer sexually active.    ASL video interpreter used to collect history and physical exam.     ROS per HPI, negative if not otherwise mentioned.        Past Medical History:  Diagnosis Date  . Arthritis   . Deaf   . Essential hypertension 04/15/2012   Evaluated at [place]- Aug 2010 with Dr. Tery Sanfilippo   . Hyperlipidemia with target LDL less than 100 04/15/2012  . OSA on CPAP 01/2019   Due to Start CPAP 03/2019    Patient Active Problem List   Diagnosis Date Noted  . 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 History of colonic polyps 10/14/2017  . Spinal stenosis of lumbar region 10/14/2017  . Medication management 08/19/2016  . CKD (chronic kidney disease) stage 3, GFR 30-59 ml/min 08/19/2016  . Family history of cancer of genitourinary system 06/04/2016  . Mixed stress and urge urinary incontinence 11/21/2015  . Obesity (BMI 30.0-34.9) 07/08/2015  . Low bone mass 04/21/2015  . Family history of premature coronary artery disease 07/05/2014  . Essential hypertension 04/15/2012  . Hyperlipidemia with target LDL less than 100 04/15/2012  . Deafness 04/15/2012    Past Surgical History:  Procedure Laterality Date  . ABDOMINAL HYSTERECTOMY     1994  . COLONOSCOPY    . DOPPLER ECHOCARDIOGRAPHY  08/30/2010   left ventricle is normal,EF= >55%  . MANDIBLE  SURGERY  1986   per patient jaws lockup  . NM MYOCAR PERF WALL MOTION  08/29/2010   POST STRESS- EF 77% NO SIGNIFICANT WALL MOTION ABNORMALITIES,EXERCISE CAPCITY 7 METS ,LOW RISK SCAN  . POLYPECTOMY      OB History   No obstetric history on file.      Home Medications    Prior to Admission medications   Medication Sig Start Date End Date Taking? Authorizing Provider  amLODipine (NORVASC) 2.5 MG tablet Take by mouth. 03/11/19  Yes [provider]  atorvastatin (LIPITOR) 10 MG tablet Take by mouth. 03/11/19  Yes [provider]  losartan (COZAAR) 100 MG tablet Take by mouth. 03/11/19  Yes [provider]  ALPRAZolam Duanne Moron) 0.5 MG tablet Take 0.5-1 tablets (0.25-0.5 mg total) by mouth 3 (three) times daily as needed for anxiety. 10/14/17   Harrison Mons, PA  amLODipine (NORVASC) 2.5 MG tablet Take 1 tablet (2.5 mg total) by mouth daily. 03/02/19   Leonie Man, MD  aspirin 81 MG tablet Take 81 mg by mouth daily.      [provider]  atorvastatin (LIPITOR) 10 MG tablet Take 1 tablet (10 mg total) by mouth daily. 03/02/19   Leonie Man, MD  calcium carbonate (OS-CAL) 600 MG TABS tablet Take 600 mg by mouth daily with breakfast.    [provider]  cephALEXin (KEFLEX) 500 MG capsule Take 1 capsule (500  mg total) by mouth 2 (two) times daily for 7 days. 07/30/19 08/06/19  Zigmund Gottron, NP  Cholecalciferol (VITAMIN D-3) 25 MCG (1000 UT) CAPS Take by mouth.    [provider]  fluconazole (DIFLUCAN) 150 MG tablet Take 1 tablet today. Take second pill at completion of antibiotics. 07/30/19   Zigmund Gottron, NP  losartan (COZAAR) 100 MG tablet Take 1 tablet (100 mg total) by mouth daily. 03/02/19   Leonie Man, MD  ofloxacin (OCUFLOX) 0.3 % ophthalmic solution PLEASE SEE ATTACHED FOR DETAILED DIRECTIONS 06/21/19   [provider]  oxybutynin (DITROPAN-XL) 5 MG 24 hr tablet Take 5 mg by mouth daily. 05/12/19   [provider]  prednisoLONE acetate (PRED FORTE) 1 % ophthalmic suspension PLEASE SEE ATTACHED FOR DETAILED DIRECTIONS 06/21/19   [provider]    Family History Family History  Problem Relation Age of Onset  . Diabetes Sister   . Cancer Sister        kidney  . Lupus Sister   . Cancer Brother        kidney, bladder, prostate  . Heart attack Mother   . Heart attack Father   . Diabetes Brother   . Stroke Brother   . Cancer Brother        kidney, bladder and prostate cancer  . Heart attack Sister   . Anemia Neg Hx   . Colon cancer Neg Hx   . Colon polyps Neg Hx   . Esophageal cancer Neg Hx   . Stomach cancer Neg Hx   . Rectal cancer Neg Hx     Social History Social History   Tobacco Use  . Smoking status: Never Smoker  . Smokeless tobacco: Never Used  Substance Use Topics  . Alcohol use: Yes    Alcohol/week: 5.0 standard drinks    Types: 5 Glasses of wine per week  . Drug use: No     Allergies   Latex   Review of Systems Review of Systems   Physical Exam Triage Vital Signs ED Triage Vitals  Enc Vitals Group     BP 07/30/19 0926 (!) 170/85     Pulse Rate 07/30/19 0926 74     Resp 07/30/19 0926 19     Temp 07/30/19 0926 98.2 F (36.8 C)     Temp Source 07/30/19 0926 Oral     SpO2 07/30/19 0926 91 %     Weight 07/30/19 0930 197 lb 12.8 oz (89.7 kg)     Height --      Head Circumference --      Peak Flow --      Pain Score 07/30/19 0930 5     Pain Loc --      Pain Edu? --      Excl. in [place]? --    No data found.  Updated Vital Signs BP (!) 170/85 (BP Location: Right Arm)   Pulse 74   Temp 98.2 F (36.8 C) (Oral)   Resp 19   Wt 197 lb 12.8 oz (89.7 kg)   SpO2 91%   BMI 37.37 kg/m    Physical Exam Constitutional:      General: She is not in acute distress.    Appearance: She is well-developed.  Cardiovascular:     Rate and Rhythm: Normal rate.  Pulmonary:     Effort: Pulmonary effort is normal.  Abdominal:     Palpations:  Abdomen is not rigid.     Tenderness:  There is no abdominal tenderness. There is no right CVA tenderness, left CVA tenderness, guarding or rebound.  Genitourinary:    Comments: Denies sores, lesions, vaginal bleeding; no pelvic pain; gu exam deferred at this time, vaginal self swab collected.   Skin:    General: Skin is warm and dry.  Neurological:     Mental Status: She is alert and oriented to person, place, and time.      UC Treatments / Results  Labs (all labs ordered are listed, but only abnormal results are displayed) Labs Reviewed  POCT URINALYSIS DIP (DEVICE) - Abnormal; Notable for the following components:      Result Value   Hgb urine dipstick TRACE (*)    Leukocytes,Ua SMALL (*)    All other components within normal limits  URINE CULTURE  CERVICOVAGINAL ANCILLARY ONLY    EKG   Radiology No results found.  Procedures Procedures (including critical care time)  Medications Ordered in UC Medications - No data to display  Initial Impression / Assessment and Plan / UC Course  I have reviewed the triage vital signs and the nursing notes.  Pertinent labs & imaging results that were available during my care of the patient were reviewed by me and considered in my medical decision making (see chart for details).     Keflex initiated due to small leuks and trace hgb with some urinary burning, culture pending. Vaginal cytology pending to confirm vaginal yeast, with diflucan provided. Return precautions provided. Patient indicated understanding and agreeable to plan.   Final Clinical Impressions(s) / UC Diagnoses   Final diagnoses:  Vaginal discharge  Acute cystitis without hematuria     Discharge Instructions     Your urine is concerning for mild urinary tract infection. We will start antibiotics with a culture pending to confirm this.  I will start medications for yeast infection, take 1 pill today, second pill at completion of antibiotics for UTI.  Your  vaginal test and urine test can take a few days, we would call you or send you a MyChart message if there is any change to treatment based on these results.  If symptoms worsen or do not improve in the next week to return to be seen or to follow up with your PCP.     ED Prescriptions    Medication Sig Dispense Auth. Provider   cephALEXin (KEFLEX) 500 MG capsule Take 1 capsule (500 mg total) by mouth 2 (two) times daily for 7 days. 14 capsule Augusto Gamble B, NP   fluconazole (DIFLUCAN) 150 MG tablet Take 1 tablet today. Take second pill at completion of antibiotics. 2 tablet Zigmund Gottron, NP     PDMP not reviewed this encounter.   Zigmund Gottron, NP 07/30/19 1056

## 2019-07-30 NOTE — ED Triage Notes (Signed)
Pt states her yeast infection symptoms started last night, she has an odor/burning/discharge/itching when urinating. States she has a hx of UTI's/yeast infections.

## 2019-07-30 NOTE — Discharge Instructions (Signed)
Your urine is concerning for mild urinary tract infection. We will start antibiotics with a culture pending to confirm this.  I will start medications for yeast infection, take 1 pill today, second pill at completion of antibiotics for UTI.  Your vaginal test and urine test can take a few days, we would call you or send you a MyChart message if there is any change to treatment based on these results.  If symptoms worsen or do not improve in the next week to return to be seen or to follow up with your PCP.

## 2019-08-01 LAB — URINE CULTURE: Culture: 70000 — AB

## 2019-08-02 ENCOUNTER — Telehealth (HOSPITAL_COMMUNITY): Payer: Self-pay | Admitting: Emergency Medicine

## 2019-08-02 LAB — CERVICOVAGINAL ANCILLARY ONLY
Bacterial vaginitis: POSITIVE — AB
Candida vaginitis: NEGATIVE

## 2019-08-02 MED ORDER — METRONIDAZOLE 500 MG PO TABS: 500.0000 mg | ORAL_TABLET | Freq: Two times a day (BID) | ORAL | 0 refills | Status: AC

## 2019-08-02 NOTE — Telephone Encounter (Signed)
Urine culture was treated with  keflex. Please fully complete the course of antibiotics until there are no pills left. Follow up with PCP or return for recheck if symptoms persist.  Bacterial vaginosis is positive. Pt needs treatment. Flagyl 500 mg BID x 7 days #14 no refills sent to patients pharmacy of choice.    Patient contacted by phone and made aware of    results. Pt verbalized understanding and had all questions answered.

## 2019-08-04 ENCOUNTER — Ambulatory Visit
Admission: RE | Admit: 2019-08-04 | Discharge: 2019-08-04 | Disposition: A | Discharge status: Home / Self Care | Payer: Medicare PPO | Source: Ambulatory Visit | Attending: Physician Assistant | Admitting: Physician Assistant

## 2019-08-04 ENCOUNTER — Other Ambulatory Visit: Payer: Self-pay

## 2019-08-04 DIAGNOSIS — Z1231 Encounter for screening mammogram for malignant neoplasm of breast: Secondary | ICD-10-CM

## 2019-08-13 ENCOUNTER — Ambulatory Visit: Payer: Medicare PPO | Attending: Internal Medicine

## 2019-08-13 DIAGNOSIS — Z23 Encounter for immunization: Secondary | ICD-10-CM | POA: Insufficient documentation

## 2019-08-13 NOTE — Progress Notes (Signed)
   Covid-19 Vaccination Clinic  Name:  Deanna Haley    MRN: [id number] DOB: 1946/01/28  08/13/2019  [name] was observed post Covid-19 immunization for 15 minutes without incidence. She was provided with Vaccine Information Sheet and instruction to access the V-Safe system.   [name] was instructed to call 911 with any severe reactions post vaccine: [name] Difficulty breathing  . Swelling of your face and throat  . A fast heartbeat  . A bad rash all over your body  . Dizziness and weakness    Immunizations Administered    Name Date Dose VIS Date Route   Pfizer COVID-19 Vaccine 08/13/2019  3:17 PM 0.3 mL 06/11/2019 Intramuscular   Manufacturer: [place]   Lot: [id number]   [place]: [id number]

## 2019-09-05 ENCOUNTER — Ambulatory Visit: Payer: Medicare PPO | Attending: Internal Medicine

## 2019-09-05 DIAGNOSIS — Z23 Encounter for immunization: Secondary | ICD-10-CM

## 2019-09-05 NOTE — Progress Notes (Signed)
   Covid-19 Vaccination Clinic  Name:  Ayona Haraway    MRN: [id number] DOB: 1946-03-25  09/05/2019  Ms. Liao was observed post Covid-19 immunization for 15 minutes without incident. She was provided with Vaccine Information Sheet and instruction to access the V-Safe system.   Ms. Carrithers was instructed to call 911 with any severe reactions post vaccine: [name] Difficulty breathing  . Swelling of face and throat  . A fast heartbeat  . A bad rash all over body  . Dizziness and weakness   Immunizations Administered    Name Date Dose VIS Date Route   Pfizer COVID-19 Vaccine 09/05/2019  1:00 PM 0.3 mL 06/11/2019 Intramuscular   Manufacturer: [place]   Lot: [id number]   [place]: [id number]

## 2020-03-03 ENCOUNTER — Ambulatory Visit: Payer: Medicare PPO | Admitting: Cardiology

## 2020-03-03 ENCOUNTER — Other Ambulatory Visit: Payer: Self-pay

## 2020-03-03 ENCOUNTER — Encounter: Payer: Self-pay | Admitting: Cardiology

## 2020-03-03 VITALS — BP 138/76 | HR 66 | Ht 60.0 in | Wt 195.0 lb

## 2020-03-03 DIAGNOSIS — Z8249 Family history of ischemic heart disease and other diseases of the circulatory system: Secondary | ICD-10-CM | POA: Diagnosis not present

## 2020-03-03 DIAGNOSIS — E669 Obesity, unspecified: Secondary | ICD-10-CM

## 2020-03-03 DIAGNOSIS — I10 Essential (primary) hypertension: Secondary | ICD-10-CM | POA: Diagnosis not present

## 2020-03-03 DIAGNOSIS — E785 Hyperlipidemia, unspecified: Secondary | ICD-10-CM | POA: Diagnosis not present

## 2020-03-03 MED ORDER — ATORVASTATIN CALCIUM 10 MG PO TABS: 10.0000 mg | ORAL_TABLET | Freq: Every day | ORAL | 3 refills | Status: DC

## 2020-03-03 MED ORDER — LOSARTAN POTASSIUM 100 MG PO TABS: 100.0000 mg | ORAL_TABLET | Freq: Every day | ORAL | 3 refills | Status: DC

## 2020-03-03 MED ORDER — AMLODIPINE BESYLATE 2.5 MG PO TABS: 2.5000 mg | ORAL_TABLET | Freq: Every day | ORAL | 3 refills | Status: DC

## 2020-03-03 NOTE — Progress Notes (Signed)
Primary Care Provider: Harrison Mons, PA Cardiologist: No primary care provider on file. Electrophysiologist: None  Clinic Note: Chief Complaint  Patient presents with  . Follow-up    Annual  . Hypertension    Borderline control    HPI:    Deanna Haley is a 74 y.o. female with a PMH notable for cardiac risk factors of hypertension hyperlipidemia with a significant family history of CAD (with negative cardiac work-up to date) below who presents today for annual follow-up.  Deanna Haley was last seen on March 02, 2019 via telemedicine using a third-party sign language interpreter. -->  She was doing well.  No major symptoms.  Only getting "lazy with the COVID-19 lockdown ".  Was diagnosed with OSA and was getting set up for CPAP.  Recent Hospitalizations: None  Reviewed  CV studies:    The following studies were reviewed today: (if available, images/films reviewed: From Epic Chart or Care Everywhere) . none:   Interval History:   Deanna Haley returns today for in person evaluation accompanied by the son language interpreter.  She is in great spirits as usual.  Smiling.  She is quite active walking around doing things, does not necessarily get to the gym very often.  She bemoans the idea that she is not able to travel because of the Covid pandemic.  She is getting frustrated not being able to travel. She had been doing pretty well up until Christmas time with avoiding weight gain, but now has gained quite a bit of weight.  As such she is little more short of breath when she does things and has some more arthritis symptoms that limit activity.  Essentially normal CV Review of Symptoms (Summary): positive for - Some exertional dyspnea and mild swelling. negative for - chest pain, edema, irregular heartbeat, orthopnea, palpitations, paroxysmal nocturnal dyspnea, rapid heart rate, shortness of breath or Lopressor dizziness, syncope/near syncope, TIA/amaurosis fugax,  claudication  The patient does not have symptoms concerning for COVID-19 infection (fever, chills, cough, or new shortness of breath).   REVIEWED OF SYSTEMS   ROS  I have reviewed and (if needed) personally updated the patient's problem list, medications, allergies, past medical and surgical history, social and family history.   PAST MEDICAL HISTORY   Past Medical History:  Diagnosis Date  . Arthritis   . Deaf   . Essential hypertension 04/15/2012   Evaluated at [place]- Aug 2010 with Dr. Tery Sanfilippo   . Hyperlipidemia with target LDL less than 100 04/15/2012  . OSA on CPAP 01/2019   Due to Start CPAP 03/2019    PAST SURGICAL HISTORY   Past Surgical History:  Procedure Laterality Date  . ABDOMINAL HYSTERECTOMY     1994  . COLONOSCOPY    . DOPPLER ECHOCARDIOGRAPHY  08/30/2010   left ventricle is normal,EF= >55%  . MANDIBLE SURGERY  1986   per patient jaws lockup  . NM MYOCAR PERF WALL MOTION  08/29/2010   POST STRESS- EF 77% NO SIGNIFICANT WALL MOTION ABNORMALITIES,EXERCISE CAPCITY 7 METS ,LOW RISK SCAN  . POLYPECTOMY      Immunization History  Administered Date(s) Administered  . Influenza Split 04/15/2012  . Influenza, High Dose Seasonal PF 04/16/2016  . Influenza,inj,Quad PF,6+ Mos 03/25/2013, 03/07/2015  . Influenza-Unspecified 04/08/2014, 04/09/2016, 04/18/2017  . PFIZER SARS-COV-2 Vaccination 08/13/2019, 09/05/2019  . Pneumococcal Conjugate-13 04/28/2014  . Pneumococcal Polysaccharide-23 03/18/2011, 06/04/2016  . Tdap 09/24/2012  . Zoster 03/02/2011    MEDICATIONS/ALLERGIES   Current Meds  Medication Sig  . ALPRAZolam (XANAX) 0.5 MG tablet Take 0.5-1 tablets (0.25-0.5 mg total) by mouth 3 (three) times daily as needed for anxiety.  [name] amLODipine (NORVASC) 2.5 MG tablet Take 1 tablet (2.5 mg total) by mouth daily.  [name] aspirin 81 MG tablet Take 81 mg by mouth daily.    [name] atorvastatin (LIPITOR) 10 MG tablet Take 1 tablet (10 mg  total) by mouth daily.  . calcium carbonate (OS-CAL) 600 MG TABS tablet Take 600 mg by mouth daily with breakfast.  . Cholecalciferol (VITAMIN D-3) 25 MCG (1000 UT) CAPS Take by mouth.  . fluconazole (DIFLUCAN) 150 MG tablet Take 1 tablet today. Take second pill at completion of antibiotics.  [name] losartan (COZAAR) 100 MG tablet Take 1 tablet (100 mg total) by mouth daily.  [name] ofloxacin (OCUFLOX) 0.3 % ophthalmic solution PLEASE SEE ATTACHED FOR DETAILED DIRECTIONS  . oxybutynin (DITROPAN-XL) 5 MG 24 hr tablet Take 5 mg by mouth daily.  . prednisoLONE acetate (PRED FORTE) 1 % ophthalmic suspension PLEASE SEE ATTACHED FOR DETAILED DIRECTIONS  . [DISCONTINUED] amLODipine (NORVASC) 2.5 MG tablet Take by mouth.  . [DISCONTINUED] atorvastatin (LIPITOR) 10 MG tablet Take 1 tablet (10 mg total) by mouth daily.  . [DISCONTINUED] losartan (COZAAR) 100 MG tablet Take 1 tablet (100 mg total) by mouth daily.    Allergies  Allergen Reactions  . Latex     SOCIAL HISTORY/FAMILY HISTORY   Reviewed in Epic:  Pertinent findings: No change  OBJCTIVE -PE, EKG, labs   Wt Readings from Last 3 Encounters:  03/03/20 195 lb (88.5 kg)  07/30/19 197 lb 12.8 oz (89.7 kg)  03/02/19 179 lb (81.2 kg)    Physical Exam: BP 138/76   Pulse 66   Ht 5' (1.524 m)   Wt 195 lb (88.5 kg)   BMI 38.08 kg/m  Physical Exam Vitals reviewed.  Constitutional:      General: She is not in acute distress.    Appearance: Normal appearance. She is not ill-appearing.     Comments: Borderline morbidly obese-mildly heavier than last visit.  HENT:     Head: Normocephalic and atraumatic.     Ears:     Comments: Deaf-uses sign language and reads lips Neck:     Vascular: No carotid bruit, hepatojugular reflux or JVD.  Cardiovascular:     Rate and Rhythm: Normal rate and regular rhythm.     Pulses: Normal pulses.     Heart sounds: Normal heart sounds. No murmur heard.  No friction rub. No gallop.   Pulmonary:     Effort:  Pulmonary effort is normal. No respiratory distress.     Breath sounds: Normal breath sounds.  Chest:     Chest wall: No tenderness.  Musculoskeletal:        General: No swelling. Normal range of motion.     Cervical back: Normal range of motion.  Neurological:     General: No focal deficit present.     Mental Status: She is alert and oriented to person, place, and time.  Psychiatric:        Mood and Affect: Mood normal.        Behavior: Behavior normal.        Thought Content: Thought content normal.        Judgment: Judgment normal.     Adult ECG Report N/A  Recent Labs: Due for labs to be checked by PCP soon. Lab Results  Component Value Date   CHOL 198 02/23/2019  HDL 82 02/23/2019   LDLCALC 101 (H) 02/23/2019   LDLDIRECT 87 10/27/2014   TRIG 76 02/23/2019   CHOLHDL 2.4 02/23/2019   Lab Results  Component Value Date   CREATININE 1.03 (H) 02/23/2019   BUN 19 02/23/2019   NA 139 02/23/2019   K 4.4 02/23/2019   CL 106 02/23/2019   CO2 22 02/23/2019   Lab Results  Component Value Date   TSH 1.270 06/17/2017    ASSESSMENT/PLAN    Problem List Items Addressed This Visit    Essential hypertension - Primary (Chronic)    Blood pressures are borderline elevated here.  Have asked that she maintains a blood pressure log. Blood pressure on recheck today was 130/70,000,000 mercury, however she says that her blood pressures at home seem to be in the 135/70 range.  Recommendation of her pressures were to increase and stay above the 135/70 mm range, will be to increase amlodipine to 5 mg daily.      Relevant Medications   amLODipine (NORVASC) 2.5 MG tablet   atorvastatin (LIPITOR) 10 MG tablet   losartan (COZAAR) 100 MG tablet   Other Relevant Orders   EKG 12-Lead (Completed)   Hyperlipidemia with target LDL less than 100 (Chronic)    She is on atorvastatin 10mg  is due for labs rechecked soon.  Unfortunately, labs not yet available for this year.  Borderline control  last year.  Goal being less than 100.      Relevant Medications   amLODipine (NORVASC) 2.5 MG tablet   atorvastatin (LIPITOR) 10 MG tablet   losartan (COZAAR) 100 MG tablet   Other Relevant Orders   EKG 12-Lead (Completed)   Family history of premature coronary artery disease (Chronic)    No active cardiac symptoms.  Continue adequate blood pressure and cholesterol control.  Monitor for symptoms.  Continue to stay active.      Obesity (BMI 30.0-34.9) (Chronic)    Talk about continued exercise and dietary adjustment to try to lose weight.  Losing weight will help blood pressure and lipids        COVID-19 Education: The signs and symptoms of COVID-19 were discussed with the patient and how to seek care for testing (follow up with PCP or arrange E-visit).   The importance of social distancing and COVID-19 vaccination was discussed today. The patient is practicing social distancing & Masking.   I spent a total of 57minutes with the patient spent in direct patient consultation.  Additional time spent with chart review  / charting (studies, outside notes, etc): 6 Total Time: 24 min   Current medicines are reviewed at length with the patient today.  (+/- concerns) n/a  Notice: This dictation was prepared with Dragon dictation along with smaller phrase technology. Any transcriptional errors that result from this process are unintentional and may not be corrected upon review.  Patient Instructions / Medication Changes & Studies & Tests Ordered   Patient Instructions  Medication Instructions:  The current medical regimen is effective;  continue present plan and medications.  *If you need a refill on your cardiac medications before your next appointment, please call your pharmacy*   Follow-Up: At [facility], you and your health needs are our priority.  As part of our continuing mission to provide you with exceptional heart care, we have created designated Provider Care Teams.   These Care Teams include your primary Cardiologist (physician) and Advanced Practice Providers (APPs -  Physician Assistants and Nurse Practitioners) who all work together to  provide you with the care you need, when you need it.  We recommend signing up for the patient portal called "MyChart".  Sign up information is provided on this After Visit Summary.  MyChart is used to connect with patients for Virtual Visits (Telemedicine).  Patients are able to view lab/test results, encounter notes, upcoming appointments, etc.  Non-urgent messages can be sent to your provider as well.   To learn more about what you can do with MyChart, go to [email or web address].    Your next appointment:   12 month(s)  The format for your next appointment:   In Person  Provider:   Glenetta Hew, MD    Studies Ordered:   Orders Placed This Encounter  Procedures  . EKG 12-Lead     Glenetta Hew, M.D., M.S. Interventional Cardiologist   Pager # [phone] Phone # [phone] [address]. [place], [place] [address]   Thank you for choosing Heartcare at [facility]!!

## 2020-03-03 NOTE — Patient Instructions (Addendum)
Medication Instructions:  The current medical regimen is effective;  continue present plan and medications.  *If you need a refill on your cardiac medications before your next appointment, please call your pharmacy*   Follow-Up: At [facility], you and your health needs are our priority.  As part of our continuing mission to provide you with exceptional heart care, we have created designated Provider Care Teams.  These Care Teams include your primary Cardiologist (physician) and Advanced Practice Providers (APPs -  Physician Assistants and Nurse Practitioners) who all work together to provide you with the care you need, when you need it.  We recommend signing up for the patient portal called "MyChart".  Sign up information is provided on this After Visit Summary.  MyChart is used to connect with patients for Virtual Visits (Telemedicine).  Patients are able to view lab/test results, encounter notes, upcoming appointments, etc.  Non-urgent messages can be sent to your provider as well.   To learn more about what you can do with MyChart, go to [email or web address].    Your next appointment:   12 month(s)  The format for your next appointment:   In Person  Provider:   Glenetta Hew, MD

## 2020-03-13 ENCOUNTER — Encounter: Payer: Self-pay | Admitting: Cardiology

## 2020-03-13 NOTE — Assessment & Plan Note (Signed)
She is on atorvastatin 10mg  is due for labs rechecked soon.  Unfortunately, labs not yet available for this year.  Borderline control last year.  Goal being less than 100.

## 2020-03-13 NOTE — Assessment & Plan Note (Signed)
No active cardiac symptoms.  Continue adequate blood pressure and cholesterol control.  Monitor for symptoms.  Continue to stay active.

## 2020-03-13 NOTE — Assessment & Plan Note (Signed)
Talk about continued exercise and dietary adjustment to try to lose weight.  Losing weight will help blood pressure and lipids

## 2020-03-13 NOTE — Assessment & Plan Note (Signed)
Blood pressures are borderline elevated here.  Have asked that she maintains a blood pressure log. Blood pressure on recheck today was 130/70,000,000 mercury, however she says that her blood pressures at home seem to be in the 135/70 range.  Recommendation of her pressures were to increase and stay above the 135/70 mm range, will be to increase amlodipine to 5 mg daily.

## 2020-09-12 ENCOUNTER — Other Ambulatory Visit: Payer: Self-pay | Admitting: Physician Assistant

## 2020-09-12 DIAGNOSIS — Z1231 Encounter for screening mammogram for malignant neoplasm of breast: Secondary | ICD-10-CM

## 2020-11-20 ENCOUNTER — Ambulatory Visit
Admission: RE | Admit: 2020-11-20 | Discharge: 2020-11-20 | Disposition: A | Discharge status: Home / Self Care | Payer: Medicare PPO | Source: Ambulatory Visit | Attending: Physician Assistant | Admitting: Physician Assistant

## 2020-11-20 DIAGNOSIS — Z1231 Encounter for screening mammogram for malignant neoplasm of breast: Secondary | ICD-10-CM

## 2021-01-05 ENCOUNTER — Emergency Department (HOSPITAL_COMMUNITY)
Admission: EM | Admit: 2021-01-05 | Discharge: 2021-01-05 | Disposition: A | Discharge status: Home / Self Care | Payer: Medicare PPO | Attending: Emergency Medicine | Admitting: Emergency Medicine

## 2021-01-05 ENCOUNTER — Other Ambulatory Visit: Payer: Self-pay

## 2021-01-05 ENCOUNTER — Encounter (HOSPITAL_COMMUNITY): Payer: Self-pay | Admitting: Emergency Medicine

## 2021-01-05 ENCOUNTER — Emergency Department (HOSPITAL_COMMUNITY): Payer: Medicare PPO

## 2021-01-05 DIAGNOSIS — Z23 Encounter for immunization: Secondary | ICD-10-CM | POA: Insufficient documentation

## 2021-01-05 DIAGNOSIS — Z7982 Long term (current) use of aspirin: Secondary | ICD-10-CM | POA: Diagnosis not present

## 2021-01-05 DIAGNOSIS — Z79899 Other long term (current) drug therapy: Secondary | ICD-10-CM | POA: Insufficient documentation

## 2021-01-05 DIAGNOSIS — I129 Hypertensive chronic kidney disease with stage 1 through stage 4 chronic kidney disease, or unspecified chronic kidney disease: Secondary | ICD-10-CM | POA: Diagnosis not present

## 2021-01-05 DIAGNOSIS — S91312A Laceration without foreign body, left foot, initial encounter: Secondary | ICD-10-CM

## 2021-01-05 DIAGNOSIS — W25XXXA Contact with sharp glass, initial encounter: Secondary | ICD-10-CM | POA: Insufficient documentation

## 2021-01-05 DIAGNOSIS — N183 Chronic kidney disease, stage 3 unspecified: Secondary | ICD-10-CM | POA: Diagnosis not present

## 2021-01-05 DIAGNOSIS — S99922A Unspecified injury of left foot, initial encounter: Secondary | ICD-10-CM | POA: Diagnosis present

## 2021-01-05 DIAGNOSIS — Z91040 Latex allergy status: Secondary | ICD-10-CM | POA: Diagnosis not present

## 2021-01-05 LAB — CBG MONITORING, ED: Glucose-Capillary: 102 mg/dL — ABNORMAL HIGH (ref 70–99)

## 2021-01-05 MED ORDER — TETANUS-DIPHTH-ACELL PERTUSSIS 5-2.5-18.5 LF-MCG/0.5 IM SUSY
0.5000 mL | PREFILLED_SYRINGE | Freq: Once | INTRAMUSCULAR | Status: AC
  Administered 2021-01-05: 0.5 mL via INTRAMUSCULAR
  Filled 2021-01-05: qty 0.5

## 2021-01-05 MED ORDER — CEPHALEXIN 500 MG PO CAPS: 500.0000 mg | ORAL_CAPSULE | Freq: Three times a day (TID) | ORAL | 0 refills | Status: AC

## 2021-01-05 NOTE — ED Provider Notes (Signed)
[facility] EMERGENCY DEPARTMENT Provider Note   CSN: [id number] Arrival date & time: 01/05/21  1513     History Chief Complaint  Patient presents with   Extremity Laceration    Deanna Haley is a 75 y.o. female.  HPI ASL interpreter was used for the entirety of patient visit.  Patient with history of hypertension, HLD and OSA on CPAP.  Patient presents to the ER today with complaints of left foot injury.  She is uncertain of her last tetanus vaccine.  She states that approximately 1 PM today was when her injury occurred.   Pt complains of left foot pain she states at approximately 1 PM today patient dropped a glass on her left foot she states that this caused a laceration to her left foot.  She is uncertain of her last tetanus.  She states that she had a significant amount of bleeding and was unable to prevent the wound from continuing to bleed.  She states that she has a laceration on the top of her foot which is aching and constant painful.  She is not on any blood thinners.  She denies any numbness or weakness in her foot.  No other injuries from the incident.  She states she felt somewhat lightheaded after the incident took 1 teaspoon of sugar and felt somewhat better.     Past Medical History:  Diagnosis Date   Arthritis    Deaf    Essential hypertension 04/15/2012   Evaluated at [facility]- Aug 2010 with Dr. 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   History of colonic polyps 10/14/2017   Spinal stenosis of lumbar region 10/14/2017   Medication management 08/19/2016   CKD (chronic kidney disease) stage 3, GFR 30-59 ml/min (HCC) 08/19/2016   Family history of cancer of genitourinary system 06/04/2016   Mixed stress and urge urinary incontinence 11/21/2015    Obesity (BMI 30.0-34.9) 07/08/2015   Low bone mass 04/21/2015   Family history of premature coronary artery disease 07/05/2014   Essential hypertension 04/15/2012   Hyperlipidemia with target LDL less than 100 04/15/2012   Deafness 04/15/2012    Past Surgical History:  Procedure Laterality Date   ABDOMINAL HYSTERECTOMY     1994   COLONOSCOPY     DOPPLER ECHOCARDIOGRAPHY  08/30/2010   left ventricle is normal,EF= >55%   MANDIBLE SURGERY  1986   per patient jaws lockup   NM MYOCAR PERF WALL MOTION  08/29/2010   POST STRESS- EF 77% NO SIGNIFICANT WALL MOTION ABNORMALITIES,EXERCISE CAPCITY 7 METS ,LOW RISK SCAN   POLYPECTOMY       OB History   No obstetric history on file.     Family History  Problem Relation Age of Onset   Diabetes Sister    Cancer Sister        kidney   Lupus Sister    Cancer Brother        kidney, bladder, prostate   Heart attack Mother    Heart attack Father    Diabetes Brother    Stroke Brother    Cancer Brother        kidney, bladder and prostate cancer   Heart attack Sister    Anemia Neg Hx    Colon cancer Neg  Hx    Colon polyps Neg Hx    Esophageal cancer Neg Hx    Stomach cancer Neg Hx    Rectal cancer Neg Hx     Social History   Tobacco Use   Smoking status: Never   Smokeless tobacco: Never  Vaping Use   Vaping Use: Never used  Substance Use Topics   Alcohol use: Yes    Alcohol/week: 5.0 standard drinks    Types: 5 Glasses of wine per week   Drug use: No    Home Medications Prior to Admission medications   Medication Sig Start Date End Date Taking? Authorizing Provider  cephALEXin (KEFLEX) 500 MG capsule Take 1 capsule (500 mg total) by mouth 3 (three) times daily for 3 days. 01/05/21 01/08/21 Yes Tishana Clinkenbeard, Kathleene Hazel, PA  ALPRAZolam (XANAX) 0.5 MG tablet Take 0.5-1 tablets (0.25-0.5 mg total) by mouth 3 (three) times daily as needed for anxiety. 10/14/17   Harrison Mons, PA  amLODipine (NORVASC) 2.5 MG tablet Take 1 tablet (2.5 mg  total) by mouth daily. 03/03/20   Leonie Man, MD  aspirin 81 MG tablet Take 81 mg by mouth daily.      [provider]  atorvastatin (LIPITOR) 10 MG tablet Take 1 tablet (10 mg total) by mouth daily. 03/03/20   Leonie Man, MD  calcium carbonate (OS-CAL) 600 MG TABS tablet Take 600 mg by mouth daily with breakfast.    [provider]  Cholecalciferol (VITAMIN D-3) 25 MCG (1000 UT) CAPS Take by mouth.    [provider]  fluconazole (DIFLUCAN) 150 MG tablet Take 1 tablet today. Take second pill at completion of antibiotics. 07/30/19   Zigmund Gottron, NP  losartan (COZAAR) 100 MG tablet Take 1 tablet (100 mg total) by mouth daily. 03/03/20   Leonie Man, MD  ofloxacin (OCUFLOX) 0.3 % ophthalmic solution PLEASE SEE ATTACHED FOR DETAILED DIRECTIONS 06/21/19   [provider]  oxybutynin (DITROPAN-XL) 5 MG 24 hr tablet Take 5 mg by mouth daily. 05/12/19   [provider]  prednisoLONE acetate (PRED FORTE) 1 % ophthalmic suspension PLEASE SEE ATTACHED FOR DETAILED DIRECTIONS 06/21/19   [provider]    Allergies    Latex  Review of Systems   Review of Systems  Constitutional:  Negative for fever.  HENT:  Negative for congestion.   Respiratory:  Negative for shortness of breath.   Cardiovascular:  Negative for chest pain.  Gastrointestinal:  Negative for abdominal distention.  Neurological:  Positive for light-headedness (Now resolved). Negative for dizziness and headaches.   Physical Exam Updated Vital Signs BP 112/60   Pulse 95   Temp 99 F (37.2 C) (Oral)   Resp 18   SpO2 100%   Physical Exam Vitals and nursing note reviewed.  Constitutional:      General: She is not in acute distress.    Appearance: Normal appearance. She is not ill-appearing.  HENT:     Head: Normocephalic and atraumatic.  Eyes:     General: No scleral icterus.       Right eye: No discharge.        Left eye: No discharge.      Conjunctiva/sclera: Conjunctivae normal.  Pulmonary:     Effort: Pulmonary effort is normal.     Breath sounds: No stridor.  Musculoskeletal:     Comments: Small puncture wound to the left dorsum of the foot.  Not currently bleeding.  Sensation intact surrounding this area.  Able  to wiggle toes without difficulty plantarflex dorsiflex foot with good strength.  Neurological:     Mental Status: She is alert and oriented to person, place, and time. Mental status is at baseline.    ED Results / Procedures / Treatments   Labs (all labs ordered are listed, but only abnormal results are displayed) Labs Reviewed  CBG MONITORING, ED - Abnormal; Notable for the following components:      Result Value   Glucose-Capillary 102 (*)    All other components within normal limits    EKG None  Radiology DG Foot 2 Views Left  Result Date: 01/05/2021 CLINICAL DATA:  Foot laceration.  Concern for foreign body. EXAM: LEFT FOOT - 2 VIEW 07/18/2016: 07/18/2016 FINDINGS: Mild hallux valgus deformity with degenerative changes of the first metatarsophalangeal joint. Small Achilles and calcaneal spurs. No radiopaque foreign object. IMPRESSION: No evidence of radiopaque foreign body. Electronically Signed   By: Abigail Miyamoto M.D.   On: 01/05/2021 17:56    Procedures Procedures   Medications Ordered in ED Medications  Tdap (BOOSTRIX) injection 0.5 mL (has no administration in time range)    ED Course  I have reviewed the triage vital signs and the nursing notes.  Pertinent labs & imaging results that were available during my care of the patient were reviewed by me and considered in my medical decision making (see chart for details).    MDM Rules/Calculators/A&P                          Patient is a small not gaping but painful laceration to the left dorsum of the foot.  She is distally neurovascular intact.  No significant bleeding noted.  Vital signs within normal limits she is  well-appearing.  Updated on tetanus.  Given instructions on wound care.  Cleaned and bandaged here in the ER.  Discharged with wound instructions printed for her and short course of 3 times daily Keflex for infection prevention given that she is elderly and the injuries to her foot. X-ray obtained and personally reviewed there is no evidence of radiopaque foreign body and I have low suspicion for radiolucent foreign body present. Strict return precautions given.  Patient understanding plan.  Final Clinical Impression(s) / ED Diagnoses Final diagnoses:  Foot laceration, left, initial encounter    Rx / DC Orders ED Discharge Orders          Ordered    cephALEXin (KEFLEX) 500 MG capsule  3 times daily        01/05/21 1929             Tedd Sias, [place] 01/05/21 1930    Noemi Chapel, MD 01/05/21 2139

## 2021-01-05 NOTE — ED Provider Notes (Signed)
Emergency Medicine Provider Triage Evaluation Note  Deanna Haley , a 75 y.o. female  was evaluated in triage.  Pt complains of left foot pain she states at approximately 1 PM today patient dropped a glass on her left foot she states that this caused a laceration to her left foot.  She is uncertain of her last tetanus.  She states that she had a significant amount of bleeding and was unable to prevent the wound from continuing to bleed.  She states that she has a laceration on the top of her foot which is aching and constant painful.  She is not on any blood thinners.  She denies any numbness or weakness in her foot.  No other injuries from the incident.  She states she felt somewhat lightheaded after the incident took 1 teaspoon of sugar and felt somewhat better.  Review of Systems  Positive: Foot laceration Negative: Chest pain  Physical Exam  BP 112/60   Pulse 95   Temp 99 F (37.2 C) (Oral)   Resp 18   SpO2 100%  Gen:   Awake, no distress   Resp:  Normal effort  MSK:   Moves extremities without difficulty  Other:  Small puncture wound to the left dorsum of the foot.  Not currently bleeding.  Medical Decision Making  Medically screening exam initiated at 4:36 PM.  Appropriate orders placed.  Deanna Haley was informed that the remainder of the evaluation will be completed by another provider, this initial triage assessment does not replace that evaluation, and the importance of remaining in the ED until their evaluation is complete.  Will obtain x-ray to evaluate for radiopaque foreign body. Will go ahead and obtain Tdap.   Pati Gallo [place], [place] 01/05/21 1645    Noemi Chapel, MD 01/05/21 2139

## 2021-01-05 NOTE — Discharge Instructions (Addendum)
Your wound is very well-healing please keep it clean and covered.  You may use antibiotic ointment once daily otherwise keep it covered to keep it clean.  May wash warm soapy water once per day before he put the antibiotic ointment on.  Please follow-up with your primary care provider.  I have also prescribed you a short course of antibiotics to take until the tablets are gone you may take 3 times daily.  1 tablet with each meal.

## 2021-01-05 NOTE — ED Triage Notes (Addendum)
Patient triaged using the ASL interpreter. The patient reports a piece of glass falling on to her left foot around 1300 today. Bleeding soaked through 2 towels she began feeling lighted headed and took a few scoops of sugar and tylenol. Bleeding controlled with bandage and towel. 1 laceration noted. No blood thinners.

## 2021-03-20 ENCOUNTER — Other Ambulatory Visit: Payer: Self-pay | Admitting: Cardiology

## 2021-03-20 DIAGNOSIS — E785 Hyperlipidemia, unspecified: Secondary | ICD-10-CM

## 2021-05-10 ENCOUNTER — Other Ambulatory Visit: Payer: Self-pay | Admitting: Cardiology

## 2021-05-23 ENCOUNTER — Other Ambulatory Visit: Payer: Self-pay | Admitting: Cardiology

## 2021-05-23 DIAGNOSIS — I10 Essential (primary) hypertension: Secondary | ICD-10-CM

## 2021-06-13 ENCOUNTER — Other Ambulatory Visit: Payer: Self-pay | Admitting: Cardiology

## 2021-06-13 DIAGNOSIS — I10 Essential (primary) hypertension: Secondary | ICD-10-CM

## 2021-06-14 ENCOUNTER — Other Ambulatory Visit: Payer: Self-pay | Admitting: Cardiology

## 2021-06-14 DIAGNOSIS — E785 Hyperlipidemia, unspecified: Secondary | ICD-10-CM

## 2021-07-21 ENCOUNTER — Other Ambulatory Visit: Payer: Self-pay | Admitting: Cardiology

## 2021-07-21 DIAGNOSIS — I10 Essential (primary) hypertension: Secondary | ICD-10-CM

## 2021-08-07 ENCOUNTER — Other Ambulatory Visit: Payer: Self-pay | Admitting: Cardiology

## 2021-08-19 ENCOUNTER — Other Ambulatory Visit: Payer: Self-pay | Admitting: Cardiology

## 2021-08-19 DIAGNOSIS — I10 Essential (primary) hypertension: Secondary | ICD-10-CM

## 2021-09-02 ENCOUNTER — Other Ambulatory Visit: Payer: Self-pay | Admitting: Cardiology

## 2021-09-12 ENCOUNTER — Other Ambulatory Visit: Payer: Self-pay | Admitting: Cardiology

## 2021-09-12 DIAGNOSIS — E785 Hyperlipidemia, unspecified: Secondary | ICD-10-CM

## 2021-09-18 ENCOUNTER — Other Ambulatory Visit: Payer: Self-pay | Admitting: Cardiology

## 2021-09-18 DIAGNOSIS — I10 Essential (primary) hypertension: Secondary | ICD-10-CM

## 2021-10-04 ENCOUNTER — Other Ambulatory Visit: Payer: Self-pay | Admitting: Cardiology

## 2021-10-05 ENCOUNTER — Other Ambulatory Visit: Payer: Self-pay | Admitting: Cardiology

## 2021-10-05 DIAGNOSIS — I10 Essential (primary) hypertension: Secondary | ICD-10-CM

## 2021-10-17 ENCOUNTER — Other Ambulatory Visit: Payer: Self-pay | Admitting: Cardiology

## 2021-10-17 DIAGNOSIS — I10 Essential (primary) hypertension: Secondary | ICD-10-CM

## 2021-10-22 ENCOUNTER — Other Ambulatory Visit: Payer: Self-pay | Admitting: Cardiology

## 2021-10-25 ENCOUNTER — Other Ambulatory Visit: Payer: Self-pay | Admitting: Cardiology

## 2021-10-25 DIAGNOSIS — I10 Essential (primary) hypertension: Secondary | ICD-10-CM

## 2021-10-29 ENCOUNTER — Other Ambulatory Visit: Payer: Self-pay | Admitting: Cardiology

## 2021-10-29 DIAGNOSIS — I10 Essential (primary) hypertension: Secondary | ICD-10-CM

## 2021-11-03 ENCOUNTER — Other Ambulatory Visit: Payer: Self-pay

## 2021-11-03 ENCOUNTER — Encounter (HOSPITAL_COMMUNITY): Payer: Self-pay | Admitting: Emergency Medicine

## 2021-11-03 ENCOUNTER — Other Ambulatory Visit: Payer: Self-pay | Admitting: Cardiology

## 2021-11-03 ENCOUNTER — Emergency Department (HOSPITAL_COMMUNITY): Payer: Medicare PPO

## 2021-11-03 ENCOUNTER — Emergency Department (HOSPITAL_COMMUNITY)
Admission: EM | Admit: 2021-11-03 | Discharge: 2021-11-03 | Disposition: A | Discharge status: Home / Self Care | Payer: Medicare PPO | Attending: Emergency Medicine | Admitting: Emergency Medicine

## 2021-11-03 DIAGNOSIS — Z91040 Latex allergy status: Secondary | ICD-10-CM | POA: Diagnosis not present

## 2021-11-03 DIAGNOSIS — Z7982 Long term (current) use of aspirin: Secondary | ICD-10-CM | POA: Insufficient documentation

## 2021-11-03 DIAGNOSIS — M25562 Pain in left knee: Secondary | ICD-10-CM

## 2021-11-03 DIAGNOSIS — I10 Essential (primary) hypertension: Secondary | ICD-10-CM

## 2021-11-03 MED ORDER — HYDROCODONE-ACETAMINOPHEN 5-325 MG PO TABS: 1.0000 | ORAL_TABLET | ORAL | 0 refills | Status: DC | PRN

## 2021-11-03 NOTE — Discharge Instructions (Addendum)
See your Orthopaedist for evaluation this week.   ?

## 2021-11-03 NOTE — ED Notes (Signed)
Pt transported to xray 

## 2021-11-03 NOTE — ED Provider Notes (Signed)
?[facility] ?Provider Note ? ? ?CSN: [id number] ?Arrival date & time: 11/03/21  3664 ? ?  ? ?History ? ?Chief Complaint  ?Patient presents with  ? Leg Pain  ? ? ?Deanna Haley is a 76 y.o. female. ? ?Pt complains of pain in her left leg.  Pt reports sudden pain her her left knee that started yesterday.  Pt reports previous history of right knee pain.   ? ?The history is provided by the patient. No language interpreter was used.  ?Leg Pain ?Location:  Knee ?Time since incident:  1 day ?Injury: no   ?Knee location:  L knee ?Pain details:  ?  Quality:  Aching ?  Radiates to:  Does not radiate ?  Severity:  Moderate ?  Onset quality:  Gradual ?  Timing:  Constant ?  Progression:  Worsening ?Dislocation: no   ?Relieved by:  Nothing ?Worsened by:  Nothing ?Ineffective treatments:  None tried ?Associated symptoms: swelling   ?Risk factors: no frequent fractures   ? ?  ? ?Home Medications ?Prior to Admission medications   ?Medication Sig Start Date End Date Taking? Authorizing Provider  ?ALPRAZolam (XANAX) 0.5 MG tablet Take 0.5-1 tablets (0.25-0.5 mg total) by mouth 3 (three) times daily as needed for anxiety. 10/14/17   Harrison Mons, PA  ?amLODipine (NORVASC) 2.5 MG tablet TAKE 1 TABLET (2.5 MG TOTAL) BY MOUTH DAILY. PATIENT NEEDS AN APPOINTMENT FOR FUTURE REFILLS. 10/22/21   Leonie Man, MD  ?aspirin 81 MG tablet Take 81 mg by mouth daily.      [provider]  ?atorvastatin (LIPITOR) 10 MG tablet TAKE 1 TABLET BY MOUTH EVERY DAY 09/12/21   Leonie Man, MD  ?calcium carbonate (OS-CAL) 600 MG TABS tablet Take 600 mg by mouth daily with breakfast.    [provider]  ?Cholecalciferol (VITAMIN D-3) 25 MCG (1000 UT) CAPS Take by mouth.    [provider]  ?fluconazole (DIFLUCAN) 150 MG tablet Take 1 tablet today. Take second pill at completion of antibiotics. 07/30/19   Zigmund Gottron, NP  ?losartan (COZAAR) 100 MG tablet TAKE 1 TABLET (100 MG  TOTAL) BY MOUTH DAILY. SCHEDULE AN APPOINTMENT FOR FURTHER REFILLS, 2ND ATTEMPT 10/18/21   Leonie Man, MD  ?ofloxacin (OCUFLOX) 0.3 % ophthalmic solution PLEASE SEE ATTACHED FOR DETAILED DIRECTIONS 06/21/19   [provider]  ?oxybutynin (DITROPAN-XL) 5 MG 24 hr tablet Take 5 mg by mouth daily. 05/12/19   [provider]  ?prednisoLONE acetate (PRED FORTE) 1 % ophthalmic suspension PLEASE SEE ATTACHED FOR DETAILED DIRECTIONS 06/21/19   [provider]  ?   ? ?Allergies    ?Latex   ? ?Review of Systems   ?Review of Systems  ?All other systems reviewed and are negative. ? ?Physical Exam ?Updated Vital Signs ?BP 130/79   Pulse 75   Temp 97.9 ?F (36.6 ?C) (Oral)   Resp 16   SpO2 100%  ?Physical Exam ?Vitals and nursing note reviewed.  ?Constitutional:   ?   Appearance: She is well-developed.  ?HENT:  ?   Head: Normocephalic.  ?Pulmonary:  ?   Effort: Pulmonary effort is normal.  ?Abdominal:  ?   General: There is no distension.  ?Musculoskeletal:     ?   General: Tenderness present.  ?   Cervical back: Normal range of motion.  ?   Comments: Limited range of motion second to pain   ?Skin: ?   General: Skin is warm.  ?  Neurological:  ?   Mental Status: She is alert and oriented to person, place, and time.  ?Psychiatric:     ?   Mood and Affect: Mood normal.  ? ? ?ED Results / Procedures / Treatments   ?Labs ?(all labs ordered are listed, but only abnormal results are displayed) ?Labs Reviewed - No data to display ? ?EKG ?None ? ?Radiology ?DG Tibia/Fibula Left ? ?Result Date: 11/03/2021 ?CLINICAL DATA:  76 year old female with history of left leg pain. EXAM: LEFT TIBIA AND FIBULA - 2 VIEW COMPARISON:  No priors. FINDINGS: There is no evidence of fracture or other focal bone lesions. Soft tissues are unremarkable. IMPRESSION: Negative. Electronically Signed   By: Vinnie Langton M.D.   On: 11/03/2021 08:19  ? ?DG Knee Complete 4 Views Left ? ?Result Date: 11/03/2021 ?CLINICAL DATA:   76 year old female with history of left knee pain. EXAM: LEFT KNEE - COMPLETE 4+ VIEW COMPARISON:  No priors. FINDINGS: Five views of the left knee demonstrate no acute displaced fracture, subluxation or dislocation. There is joint space narrowing, subchondral sclerosis and osteophyte formation in a tricompartmental distribution, most severe in the medial and patellofemoral compartments, indicative of osteoarthritis. IMPRESSION: 1. No acute radiographic abnormality of the left knee. 2. Mild-to-moderate osteoarthritis, most severe in the medial and patellofemoral compartments. Electronically Signed   By: Vinnie Langton M.D.   On: 11/03/2021 08:19   ? ?Procedures ?Procedures  ? ? ?Medications Ordered in ED ?Medications - No data to display ? ?ED Course/ Medical Decision Making/ A&P ?  ?                        ?Medical Decision Making ?Pt complains of knee pain   ? ?Amount and/or Complexity of Data Reviewed ?Radiology: ordered and independent interpretation performed. Decision-making details documented in ED Course. ?   Details: Xray no fracture  mild to moderate osteoarthritis ? ?Risk ?Prescription drug management. ?Risk Details: Pt placed in a knee immbolizer.  Pt advised to use walker.  Follow up with your Orthopaedist for recheck in 1 week  ? ? ? ? ? ? ? ? ? ? ?Final Clinical Impression(s) / ED Diagnoses ?Final diagnoses:  ?Acute pain of left knee  ? ? ?Rx / DC Orders ?ED Discharge Orders   ? ?      Ordered  ?  HYDROcodone-acetaminophen (NORCO/VICODIN) 5-325 MG tablet  Every 4 hours PRN       ? 11/03/21 1011  ? ?  ?  ? ?  ?An After Visit Summary was printed and given to the patient. ? ? ?  ?Fransico Meadow, [place] ?11/03/21 1011 ? ?  ?Wyvonnia Dusky, MD ?11/03/21 1303 ? ?

## 2021-11-03 NOTE — ED Triage Notes (Addendum)
*  [facility] used for triage. Requested in-person sign language interpreter. ? ?C/o L lower leg pain.  States she went to let the dog out this morning and felt a pop in L leg.  C/o pain to L knee and L lower leg. ?

## 2021-11-03 NOTE — ED Notes (Signed)
Patient discharge instructions reviewed with the patient. The patient verbalized understanding of instructions. Patient discharged. 

## 2021-11-03 NOTE — ED Notes (Signed)
Pt returned from xray

## 2021-11-03 NOTE — Progress Notes (Signed)
Orthopedic Tech Progress Note ?Patient Details:  ?Krishana Lutze ?1945-07-19 ?[id number] ?ASL Interpreter gave instructions on when to wear the knee immobilizer and how to apply it at home when OOB  ?Ortho Devices ?Type of Ortho Device: Knee Immobilizer ?Ortho Device/Splint Location: Left knee ?Ortho Device/Splint Interventions: Application ?  ?Post Interventions ?Patient Tolerated: Well ?Instructions Provided: Adjustment of device ? ?Joseh Sjogren E Katora Fini ?11/03/2021, 10:15 AM ? ?

## 2021-11-16 ENCOUNTER — Other Ambulatory Visit: Payer: Self-pay | Admitting: Cardiology

## 2021-11-21 ENCOUNTER — Other Ambulatory Visit: Payer: Self-pay | Admitting: Physician Assistant

## 2021-11-21 DIAGNOSIS — Z1231 Encounter for screening mammogram for malignant neoplasm of breast: Secondary | ICD-10-CM

## 2021-12-02 ENCOUNTER — Other Ambulatory Visit: Payer: Self-pay | Admitting: Cardiology

## 2021-12-06 ENCOUNTER — Ambulatory Visit
Admission: RE | Admit: 2021-12-06 | Discharge: 2021-12-06 | Disposition: A | Discharge status: Home / Self Care | Payer: Medicare PPO | Source: Ambulatory Visit | Attending: Physician Assistant | Admitting: Physician Assistant

## 2021-12-06 DIAGNOSIS — Z1231 Encounter for screening mammogram for malignant neoplasm of breast: Secondary | ICD-10-CM

## 2021-12-07 ENCOUNTER — Ambulatory Visit: Payer: Medicare PPO

## 2021-12-14 ENCOUNTER — Other Ambulatory Visit: Payer: Self-pay | Admitting: Cardiology

## 2021-12-14 DIAGNOSIS — E785 Hyperlipidemia, unspecified: Secondary | ICD-10-CM

## 2021-12-17 ENCOUNTER — Encounter: Payer: Self-pay | Admitting: Cardiology

## 2021-12-17 ENCOUNTER — Ambulatory Visit: Payer: Medicare PPO | Admitting: Cardiology

## 2021-12-17 VITALS — BP 122/68 | HR 97 | Ht 60.0 in | Wt 188.4 lb

## 2021-12-17 DIAGNOSIS — I10 Essential (primary) hypertension: Secondary | ICD-10-CM

## 2021-12-17 DIAGNOSIS — Z8249 Family history of ischemic heart disease and other diseases of the circulatory system: Secondary | ICD-10-CM | POA: Diagnosis not present

## 2021-12-17 DIAGNOSIS — E785 Hyperlipidemia, unspecified: Secondary | ICD-10-CM

## 2021-12-17 DIAGNOSIS — E669 Obesity, unspecified: Secondary | ICD-10-CM

## 2021-12-17 DIAGNOSIS — Z01818 Encounter for other preprocedural examination: Secondary | ICD-10-CM | POA: Diagnosis not present

## 2021-12-17 NOTE — Progress Notes (Unsigned)
Primary Care Provider: Harrison Mons, PA [facility] Cardiologist: None Electrophysiologist: None  *** SIGNED  Clinic Note: No chief complaint on file.   ===================================  ASSESSMENT/PLAN   Problem List Items Addressed This Visit   None 1. Pre-operative clearance Low risk surgery w/ independent patient w/ mild systemic disease. Gupta score of a MICA (myocardial infarction or cardiac arrest) intraoperatively is 0.2%. -Can complete shoulder surgery as planned.   2. Essential hypertension Well controlled.  -Continue current medications  3. Hyperlipidemia with target LDL less than 100 At goal.  -Continue statin medication  4. Family history of premature coronary artery disease Last ECHO and NM MYOCAR PERF wnl.  -Follow up in 1 year   ===================================  HPI:    '@HCCON1'$ @  Deanna Haley was last seen on 11/20/21 by PCP for obesity management. She states she is here today for pre-op clearance. She is planning to have a right shoulder repair that is scheduled 7/18. She denies shortness of breath, chest pain, and palpitations.  She ambulates independently and tends to a garden on most days if her bilateral knee arthritis is not preventing her from doing so. She has had surgeries prior requiring anesthesia and did not have any issues. She takes her blood pressure medication and statin as prescribed.   Recent Hospitalizations: ***  Reviewed  CV studies:    The following studies were reviewed today: (if available, images/films reviewed: From Epic Chart or Care Everywhere) NM Mycar Multi w/Spect w/ wall motion:   Interval History:   Deanna Haley   CV Review of Symptoms (Summary) Cardiovascular ROS: no chest pain or dyspnea on exertion negative  REVIEWED OF SYSTEMS   ROS  I have reviewed and (if needed) personally updated the patient's problem list, medications, allergies, past medical and surgical history, social  and family history.   PAST MEDICAL HISTORY   Past Medical History:  Diagnosis Date   Arthritis    Deaf    Essential hypertension 04/15/2012   Evaluated at [facility]- Aug 2010 with Dr. Tery Sanfilippo    Hyperlipidemia with target LDL less than 100 04/15/2012   OSA on CPAP 01/2019   Due to Start CPAP 03/2019    PAST SURGICAL HISTORY   Past Surgical History:  Procedure Laterality Date   ABDOMINAL HYSTERECTOMY     1994   COLONOSCOPY     DOPPLER ECHOCARDIOGRAPHY  08/30/2010   left ventricle is normal,EF= >55%   MANDIBLE SURGERY  1986   per patient jaws lockup   NM MYOCAR PERF WALL MOTION  08/29/2010   POST STRESS- EF 77% NO SIGNIFICANT WALL MOTION ABNORMALITIES,EXERCISE CAPCITY 7 METS ,LOW RISK SCAN   POLYPECTOMY      Immunization History  Administered Date(s) Administered   Influenza Split 04/15/2012   Influenza, High Dose Seasonal PF 04/16/2016   Influenza,inj,Quad PF,6+ Mos 03/25/2013, 03/07/2015   Influenza-Unspecified 04/08/2014, 04/09/2016, 04/18/2017   PFIZER(Purple Top)SARS-COV-2 Vaccination 08/13/2019, 09/05/2019, 04/16/2020, 12/05/2020   Pfizer Covid-19 Vaccine Bivalent Booster 45yr & up 09/26/2021   Pneumococcal Conjugate-13 04/28/2014   Pneumococcal Polysaccharide-23 03/18/2011, 06/04/2016   Tdap 09/24/2012, 01/05/2021   Zoster, Live 03/02/2011    MEDICATIONS/ALLERGIES   Current Meds  Medication Sig   ALPRAZolam (XANAX) 0.5 MG tablet Take 0.5 mg by mouth at bedtime as needed for anxiety.   amLODipine (NORVASC) 2.5 MG tablet Take 1 tablet (2.5 mg total) by mouth daily. Patient must keep scheduled appointment for refills   aspirin 81 MG tablet Take 81 mg  by mouth daily.     atorvastatin (LIPITOR) 10 MG tablet Take 1 tablet (10 mg total) by mouth daily. Schedule Office Visit for Future Refills.   calcium carbonate (OS-CAL) 600 MG TABS tablet Take 600 mg by mouth daily with breakfast.   Cholecalciferol (VITAMIN D-3) 25 MCG (1000 UT)  CAPS Take by mouth.   fluconazole (DIFLUCAN) 150 MG tablet Take 1 tablet today. Take second pill at completion of antibiotics.   losartan (COZAAR) 100 MG tablet TAKE 1 TABLET (100 MG TOTAL) BY MOUTH DAILY. SCHEDULE AN APPOINTMENT FOR FURTHER REFILLS, 2ND ATTEMPT   naproxen (NAPROSYN) 500 MG tablet Take by mouth.   ofloxacin (OCUFLOX) 0.3 % ophthalmic solution PLEASE SEE ATTACHED FOR DETAILED DIRECTIONS   Semaglutide-Weight Management 0.25 MG/0.5ML SOAJ Inject into the skin.   traMADol (ULTRAM) 50 MG tablet Take 50 mg by mouth every 6 (six) hours as needed.   WEGOVY 0.25 MG/0.5ML SOAJ SMARTSIG:0.5 Milliliter(s) SUB-Q Once a Week    Allergies  Allergen Reactions   Latex     SOCIAL HISTORY/FAMILY HISTORY   Reviewed in Epic:   Social History   Tobacco Use   Smoking status: Never   Smokeless tobacco: Never  Vaping Use   Vaping Use: Never used  Substance Use Topics   Alcohol use: Yes    Alcohol/week: 5.0 standard drinks of alcohol    Types: 5 Glasses of wine per week   Drug use: No   Social History   Social History Narrative   Lives with her husband.   Their 3 adult children live locally.   Family History  Problem Relation Age of Onset   Diabetes Sister    Cancer Sister        kidney   Lupus Sister    Cancer Brother        kidney, bladder, prostate   Heart attack Mother    Heart attack Father    Diabetes Brother    Stroke Brother    Cancer Brother        kidney, bladder and prostate cancer   Heart attack Sister    Anemia Neg Hx    Colon cancer Neg Hx    Colon polyps Neg Hx    Esophageal cancer Neg Hx    Stomach cancer Neg Hx    Rectal cancer Neg Hx     OBJCTIVE -PE, EKG, labs   Wt Readings from Last 3 Encounters:  12/17/21 188 lb 6.4 oz (85.5 kg)  03/03/20 195 lb (88.5 kg)  07/30/19 197 lb 12.8 oz (89.7 kg)    Physical Exam: BP 122/68   Pulse 97   Ht 5' (1.524 m)   Wt 188 lb 6.4 oz (85.5 kg)   SpO2 97%   BMI 36.79 kg/m  Physical Exam Vitals  reviewed.  Constitutional:      General: She is not in acute distress.    Appearance: She is not ill-appearing or toxic-appearing.  Cardiovascular:     Rate and Rhythm: Normal rate and regular rhythm.     Heart sounds: Normal heart sounds. No murmur heard.    No friction rub. No gallop.  Pulmonary:     Effort: Pulmonary effort is normal.     Breath sounds: Normal breath sounds.  Musculoskeletal:     Comments: Mild bilateral LE edema  Neurological:     Mental Status: She is alert and oriented to person, place, and time.  Psychiatric:        Mood and Affect: Mood normal.  Behavior: Behavior normal.      Adult ECG Report  Rate: 97 ;  Rhythm: normal sinus rhythm;   Narrative Interpretation: Cannot rule out anterior infarct, age indeterminate poor R wave progression  Recent Labs:    Lab Results  Component Value Date   CHOL 198 02/23/2019   HDL 82 02/23/2019   LDLCALC 101 (H) 02/23/2019   LDLDIRECT 87 10/27/2014   TRIG 76 02/23/2019   CHOLHDL 2.4 02/23/2019   Lab Results  Component Value Date   CREATININE 1.03 (H) 02/23/2019   BUN 19 02/23/2019   NA 139 02/23/2019   K 4.4 02/23/2019   CL 106 02/23/2019   CO2 22 02/23/2019      Latest Ref Rng & Units 06/17/2017    9:28 AM 02/23/2017    4:34 PM 12/13/2016   10:57 AM  CBC  WBC 3.4 - 10.8 x10E3/uL 9.0   8.9   Hemoglobin 11.1 - 15.9 g/dL 11.9  12.9  12.2   Hematocrit 34.0 - 46.6 % 36.5  38.0  37.7   Platelets 150 - 379 x10E3/uL 311   288     No results found for: "HGBA1C" Lab Results  Component Value Date   TSH 1.270 06/17/2017    ==================================================  COVID-19 Education: The signs and symptoms of COVID-19 were discussed with the patient and how to seek care for testing (follow up with PCP or arrange E-visit).    I spent a total of *** minutes with the patient spent in direct patient consultation.  Additional time spent with chart review  / charting (studies, outside notes,  etc): *** min Total Time: *** min  Current medicines are reviewed at length with the patient today.  (+/- concerns) ***  This visit occurred during the SARS-CoV-2 public health emergency.  Safety protocols were in place, including screening questions prior to the visit, additional usage of staff PPE, and extensive cleaning of exam room while observing appropriate contact time as indicated for disinfecting solutions.  Notice: This dictation was prepared with Dragon dictation along with smart phrase technology. Any transcriptional errors that result from this process are unintentional and may not be corrected upon review.   Studies Ordered:  Orders Placed This Encounter  Procedures   EKG 12-Lead   No orders of the defined types were placed in this encounter.   Patient Instructions / Medication Changes & Studies & Tests Ordered   Patient Instructions  Medication Instructions:  No changes   *If you need a refill on your cardiac medications before your next appointment, please call your pharmacy*   Lab Work:  Not needed   Testing/Procedures: Not needed   Follow-Up: At [facility], you and your health needs are our priority.  As part of our continuing mission to provide you with exceptional heart care, we have created designated Provider Care Teams.  These Care Teams include your primary Cardiologist (physician) and Advanced Practice Providers (APPs -  Physician Assistants and Nurse Practitioners) who all work together to provide you with the care you need, when you need it.     Your next appointment:   12 month(s)  The format for your next appointment:   In Person  Provider:   Glenetta Hew, MD    Other Instructions     Gerlene Fee, DO 12/17/2021, 3:07 PM PGY-3, [facility]    Thank you for choosing Heartcare at [facility]!!

## 2021-12-17 NOTE — Patient Instructions (Signed)

## 2021-12-18 ENCOUNTER — Encounter: Payer: Self-pay | Admitting: Cardiology

## 2021-12-18 DIAGNOSIS — Z01818 Encounter for other preprocedural examination: Secondary | ICD-10-CM | POA: Insufficient documentation

## 2021-12-18 NOTE — Assessment & Plan Note (Signed)
Pre-operative clearance Low risk surgery w/ independent patient w/ mild systemic disease. Gupta score of a MICA (myocardial infarction or cardiac arrest) intraoperatively is 0.2%. -Can complete shoulder surgery as planned.  76 year old woman with hypertension hyperlipidemia family history of CAD but no personal history of CAD, CHF, DM-2, CVA or CKD.  Planned surgery is low risk. =>   She will be a low risk patient for low risk surgery.  No further cardiac testing recommended.    She is taking aspirin which can be held as necessary for surgery.  PREOPERATIVE CARDIAC RISK ASSESSMENT   Revised Cardiac Risk Index:  High Risk Surgery: no; low risk surgery  Defined as Intraperitoneal, intrathoracic or suprainguinal vascular  Active CAD: no; no documented history of CAD last evaluation was nonischemic  CHF: no; normal EF.  Cerebrovascular Disease: no;   Diabetes: no; On Insulin: no  CKD (Cr >~ 2): no;   Total: 0 Estimated Risk of Adverse Outcome: LOW  Estimated Risk of MI, PE, VF/VT (Cardiac Arrest), Complete Heart Block: ~0.2 %   ACC/AHA Guidelines for "Clearance":  Step 1 - Need for Emergency Surgery: No: Elective surgery  If Yes - go straight to OR with perioperative surveillance  Step 2 - Active Cardiac Conditions (Unstable Angina, Decompensated HF, Significant  Arrhytmias - Complete HB, Mobitz II, Symptomatic VT or SVT, Severe Aortic Stenosis - mean gradient > 40 mmHg, Valve area < 1.0 cm2):   No: Has never been diagnosed with CAD or CHF  If Yes - Evaluate & Treat per ACC/AHA Guidelines  Step 3 -  Low Risk Surgery: Yes  If Yes --> proceed to OR  If No --> Step 4  Step 4 - Functional Capacity >= 4 METS without symptoms: Yes  If Yes --> proceed to OR  If No --> Step 5  Step 5 --  Clinical Risk Factors (CRF)   No CRFs: YES  If Yes --> Proceed to OR -> further testing would not change management.  In the absence of ongoing symptoms, no need to test regardless  of surgery or not.

## 2021-12-18 NOTE — Assessment & Plan Note (Addendum)
Hyperlipidemia with target LDL less than 100 At goal.  -Continue statin medication  Most recent LDL down to 88.  Notable improvement from 105 in March 2022.  I suspect this is probably related to weight loss as she remains on 10 mg atorvastatin.

## 2021-12-18 NOTE — Assessment & Plan Note (Signed)
Overall she has had about 10 pound weight loss since 2021.  She is now on Wegovy with plans for continued weight loss.  Hopefully once her shoulder is operated on and her knee stabilizes, she will get exercise more.

## 2021-12-18 NOTE — Assessment & Plan Note (Signed)
Essential hypertension Well controlled.  -Continue current medications   BPs remain stable on current dose of ARB and calcium channel blocker.  Not on diuretic.

## 2021-12-18 NOTE — Progress Notes (Signed)
ATTENDING ATTESTATION  I have seen, examined and evaluated the patient along with the Resident Physician Naaman Plummer Autry-Lott, DO) in clinic today.  I personally performed my own interview & exanimation.  After reviewing all the available data and chart, we discussed the patients laboratory, study & physical findings as well as symptoms in detail. I agree with her findings, examination as well as impression recommendations as per our discussion.    Attending adjustments int the full clinic noted annotated in [place] along with EKG interpretation per Attending, .   Pre-operative clearance  Pre-operative clearance Low risk surgery w/ independent patient w/ mild systemic disease. Gupta score of a MICA (myocardial infarction or cardiac arrest) intraoperatively is 0.2%. -Can complete shoulder surgery as planned.  76 year old woman with hypertension hyperlipidemia family history of CAD but no personal history of CAD, CHF, DM-2, CVA or CKD.  Planned surgery is low risk. =>  She will be a low risk patient for low risk surgery.  No further cardiac testing recommended.   She is taking aspirin which can be held as necessary for surgery.  PREOPERATIVE CARDIAC RISK ASSESSMENT   Revised Cardiac Risk Index: High Risk Surgery: no; low risk surgery Defined as Intraperitoneal, intrathoracic or suprainguinal vascular Active CAD: no; no documented history of CAD last evaluation was nonischemic CHF: no; normal EF. Cerebrovascular Disease: no;  Diabetes: no; On Insulin: no CKD (Cr >~ 2): no;   Total: 0 Estimated Risk of Adverse Outcome: LOW  Estimated Risk of MI, PE, VF/VT (Cardiac Arrest), Complete Heart Block: ~0.2 %   ACC/AHA Guidelines for "Clearance": Step 1 - Need for Emergency Surgery: No: Elective surgery If Yes - go straight to OR with perioperative surveillance Step 2 - Active Cardiac Conditions (Unstable Angina, Decompensated HF, Significant  Arrhytmias - Complete HB, Mobitz II,  Symptomatic VT or SVT, Severe Aortic Stenosis - mean gradient > 40 mmHg, Valve area < 1.0 cm2):  No: Has never been diagnosed with CAD or CHF If Yes - Evaluate & Treat per ACC/AHA Guidelines Step 3 -  Low Risk Surgery: Yes If Yes --> proceed to OR If No --> Step 4 Step 4 - Functional Capacity >= 4 METS without symptoms: Yes If Yes --> proceed to OR If No --> Step 5 Step 5 --  Clinical Risk Factors (CRF)  No CRFs: YES If Yes --> Proceed to OR -> further testing would not change management.  In the absence of ongoing symptoms, no need to test regardless of surgery or not.    Hyperlipidemia with target LDL less than 100 Hyperlipidemia with target LDL less than 100 At goal.  -Continue statin medication  Most recent LDL down to 88.  Notable improvement from 105 in March 2022.  I suspect this is probably related to weight loss as she remains on 10 mg atorvastatin.  Obesity (BMI 30.0-34.9) Overall she has had about 10 pound weight loss since 2021.  She is now on Wegovy with plans for continued weight loss.  Hopefully once her shoulder is operated on and her knee stabilizes, she will get exercise more.  Essential hypertension Essential hypertension Well controlled.  -Continue current medications   BPs remain stable on current dose of ARB and calcium channel blocker.  Not on diuretic.  Family history of premature coronary artery disease Family history of premature coronary artery disease Last ECHO and NM MYOCAR PERF wnl.  -Follow up in 1 year  No active cardiac symptoms.  Risk factor modification  with blood pressure and lipid management.  Weight loss.     Glenetta Hew, M.D., M.S. Interventional Cardiologist   Pager # [phone] Phone # [phone] [address] [place], [place] [address]

## 2021-12-18 NOTE — Assessment & Plan Note (Signed)
Family history of premature coronary artery disease Last ECHO and NM MYOCAR PERF wnl.  -Follow up in 1 year  No active cardiac symptoms.  Risk factor modification with blood pressure and lipid management.  Weight loss.

## 2022-01-09 ENCOUNTER — Other Ambulatory Visit: Payer: Self-pay | Admitting: Cardiology

## 2022-01-09 DIAGNOSIS — E785 Hyperlipidemia, unspecified: Secondary | ICD-10-CM

## 2022-01-23 ENCOUNTER — Other Ambulatory Visit: Payer: Self-pay | Admitting: Cardiology

## 2022-01-23 DIAGNOSIS — E785 Hyperlipidemia, unspecified: Secondary | ICD-10-CM

## 2022-03-20 ENCOUNTER — Other Ambulatory Visit: Payer: Self-pay | Admitting: Cardiology

## 2022-03-20 DIAGNOSIS — E785 Hyperlipidemia, unspecified: Secondary | ICD-10-CM

## 2023-04-08 ENCOUNTER — Other Ambulatory Visit: Payer: Self-pay | Admitting: Physician Assistant

## 2023-04-08 DIAGNOSIS — Z1231 Encounter for screening mammogram for malignant neoplasm of breast: Secondary | ICD-10-CM

## 2023-04-12 ENCOUNTER — Ambulatory Visit
Admission: RE | Admit: 2023-04-12 | Discharge: 2023-04-12 | Disposition: A | Discharge status: Home / Self Care | Payer: Medicare PPO | Source: Ambulatory Visit | Attending: Physician Assistant | Admitting: Physician Assistant

## 2023-04-12 DIAGNOSIS — Z1231 Encounter for screening mammogram for malignant neoplasm of breast: Secondary | ICD-10-CM

## 2023-04-12 LAB — GLUCOSE, POCT (MANUAL RESULT ENTRY): Glucose Fasting, POC: 98 mg/dL (ref 70–99)

## 2023-05-19 ENCOUNTER — Encounter: Payer: Self-pay | Admitting: Gastroenterology

## 2023-07-30 ENCOUNTER — Encounter: Payer: Self-pay | Admitting: Gastroenterology

## 2023-09-11 ENCOUNTER — Ambulatory Visit (AMBULATORY_SURGERY_CENTER): Payer: Medicare PPO

## 2023-09-11 VITALS — Ht 60.0 in | Wt 201.0 lb

## 2023-09-11 DIAGNOSIS — Z860100 Personal history of colon polyps, unspecified: Secondary | ICD-10-CM

## 2023-09-11 NOTE — Progress Notes (Signed)

## 2023-09-22 ENCOUNTER — Encounter: Payer: Self-pay | Admitting: Gastroenterology

## 2023-09-25 ENCOUNTER — Encounter: Payer: Self-pay | Admitting: Gastroenterology

## 2023-09-25 ENCOUNTER — Ambulatory Visit (AMBULATORY_SURGERY_CENTER): Payer: Medicare PPO | Admitting: Gastroenterology

## 2023-09-25 VITALS — BP 146/86 | HR 71 | Temp 98.0°F | Resp 19 | Ht 60.0 in | Wt 201.0 lb

## 2023-09-25 DIAGNOSIS — Z860100 Personal history of colon polyps, unspecified: Secondary | ICD-10-CM

## 2023-09-25 DIAGNOSIS — Z1211 Encounter for screening for malignant neoplasm of colon: Secondary | ICD-10-CM | POA: Diagnosis not present

## 2023-09-25 DIAGNOSIS — D123 Benign neoplasm of transverse colon: Secondary | ICD-10-CM

## 2023-09-25 MED ORDER — SODIUM CHLORIDE 0.9 % IV SOLN: 500.0000 mL | Freq: Once | INTRAVENOUS | Status: DC

## 2023-09-25 NOTE — Progress Notes (Signed)
 A/o x 3, VSS, gd SR's, pleased with anesthesia, report to RN

## 2023-09-25 NOTE — Patient Instructions (Signed)

## 2023-09-25 NOTE — Progress Notes (Signed)
 Called to room to assist during endoscopic procedure.  Patient ID and intended procedure confirmed with present staff. Received instructions for my participation in the procedure from the performing physician.

## 2023-09-25 NOTE — Progress Notes (Signed)
 History and Physical:  This patient presents for endoscopic testing for: Encounter Diagnosis  Name Primary?   History of colonic polyps Yes    Surveillance colonoscopy for a Hx of diminutive sigmoid TA polyps in Oct 2019 Patient denies chronic abdominal pain, rectal bleeding, constipation or diarrhea. (sign interpreter)  Patient is otherwise without complaints or active issues today.   Past Medical History: Past Medical History:  Diagnosis Date   Anemia    Arthritis    Cataract    Deaf    Essential hypertension 04/15/2012   Evaluated at [facility]- Aug 2010 with Dr. Ritta Slot    Hyperlipidemia with target LDL less than 100 04/15/2012   OSA on CPAP 01/2019   Due to Start CPAP 03/2019     Past Surgical History: Past Surgical History:  Procedure Laterality Date   ABDOMINAL HYSTERECTOMY     1994   COLONOSCOPY     DOPPLER ECHOCARDIOGRAPHY  08/30/2010   left ventricle is normal,EF= >55%   HEEL SPUR SURGERY Right 1997   LUMBAR SPINE SURGERY Bilateral 2024   MANDIBLE SURGERY  07/01/1984   per patient jaws lockup   NM MYOCAR PERF WALL MOTION  08/29/2010   POST STRESS- EF 77% NO SIGNIFICANT WALL MOTION ABNORMALITIES,EXERCISE CAPCITY 7 METS ,LOW RISK SCAN   POLYPECTOMY      Allergies: Allergies  Allergen Reactions   Latex Itching    Outpatient Meds: Current Outpatient Medications  Medication Sig Dispense Refill   acetaminophen (TYLENOL) 500 MG tablet Take by mouth.     amLODipine (NORVASC) 2.5 MG tablet Take 1 tablet (2.5 mg total) by mouth daily. Patient must keep scheduled appointment for refills 30 tablet 0   aspirin 81 MG tablet Take 81 mg by mouth daily.       atorvastatin (LIPITOR) 10 MG tablet Take 1 tablet (10 mg total) by mouth daily. 30 tablet 10   calcium carbonate (OS-CAL) 600 MG TABS tablet Take 600 mg by mouth daily with breakfast.     Cholecalciferol (VITAMIN D-3) 25 MCG (1000 UT) CAPS Take by mouth.     cyanocobalamin  (VITAMIN B12) 1000 MCG tablet Take by mouth.     losartan (COZAAR) 100 MG tablet TAKE 1 TABLET (100 MG TOTAL) BY MOUTH DAILY. SCHEDULE AN APPOINTMENT FOR FURTHER REFILLS, 2ND ATTEMPT 15 tablet 0   ALPRAZolam (XANAX) 0.5 MG tablet Take 0.5 mg by mouth at bedtime as needed for anxiety. (Patient not taking: Reported on 09/25/2023)     ipratropium (ATROVENT) 0.03 % nasal spray SPRAY 2 SPRAYS NASALLY 3 TIMES A DAY     ofloxacin (OCUFLOX) 0.3 % ophthalmic solution PLEASE SEE ATTACHED FOR DETAILED DIRECTIONS (Patient not taking: Reported on 09/25/2023)     Semaglutide-Weight Management 0.25 MG/0.5ML SOAJ Inject into the skin. (Patient not taking: Reported on 09/25/2023)     traMADol (ULTRAM) 50 MG tablet Take 50 mg by mouth every 6 (six) hours as needed. (Patient not taking: Reported on 09/25/2023)     WEGOVY 0.25 MG/0.5ML SOAJ SMARTSIG:0.5 Milliliter(s) SUB-Q Once a Week (Patient not taking: Reported on 09/25/2023)     Current Facility-Administered Medications  Medication Dose Route Frequency Provider Last Rate Last Admin   0.9 %  sodium chloride infusion  500 mL Intravenous Once Charlie Pitter III, MD          ___________________________________________________________________ Objective   Exam:  BP (!) 159/97   Pulse 76   Temp 98 F (36.7 C) (Skin)   Resp  20   Ht 5' (1.524 m)   Wt 201 lb (91.2 kg)   SpO2 94%   BMI 39.26 kg/m   CV: regular , S1/S2 Resp: clear to auscultation bilaterally, normal RR and effort noted GI: soft, no tenderness, with active bowel sounds.   Assessment: Encounter Diagnosis  Name Primary?   History of colonic polyps Yes     Plan: Colonoscopy   The benefits and risks of the planned procedure(s) were described in detail with the patient or (when appropriate) their health care proxy.  Risks were outlined as including, but not limited to, bleeding, infection, perforation, adverse medication reaction leading to cardiac or pulmonary decompensation, pancreatitis  (if ERCP).  The limitation of incomplete mucosal visualization was also discussed.  No guarantees or warranties were given.  The patient is appropriate for an endoscopic procedure in the ambulatory setting.   - Deanna Jupiter, MD

## 2023-09-25 NOTE — Progress Notes (Signed)
 Pt's states no medical or surgical changes since previsit or office visit.

## 2023-09-25 NOTE — Op Note (Signed)
 [facility] Patient Name: Deanna Haley Procedure Date: 09/25/2023 1:26 PM MRN: [id number] Endoscopist: Sherilyn Cooter L. Myrtie Neither , MD, [id number] Age: 78 Referring MD:  Date of Birth: July 14, 1945 Gender: Female Account #: [id number] Procedure:                Colonoscopy Indications:              Surveillance: Personal history of adenomatous                            polyps on last colonoscopy > 5 years ago                           diminutive sigmoid TA in Oct 2019                           SSP in 2007, no polyps in 2012 but poor prep on                            that exam Medicines:                Monitored Anesthesia Care Procedure:                Pre-Anesthesia Assessment:                           - Prior to the procedure, a History and Physical                            was performed, and patient medications and                            allergies were reviewed. The patient's tolerance of                            previous anesthesia was also reviewed. The risks                            and benefits of the procedure and the sedation                            options and risks were discussed with the patient.                            All questions were answered, and informed consent                            was obtained. Prior Anticoagulants: The patient has                            taken no anticoagulant or antiplatelet agents. ASA                            Grade Assessment: III - A patient with severe  systemic disease. After reviewing the risks and                            benefits, the patient was deemed in satisfactory                            condition to undergo the procedure.                           After obtaining informed consent, the colonoscope                            was passed under direct vision. Throughout the                            procedure, the patient's blood pressure, pulse, and                             oxygen saturations were monitored continuously. The                            Olympus Scope SN: [id number] was introduced through                            the anus and advanced to the the cecum, identified                            by appendiceal orifice and ileocecal valve. The                            colonoscopy was performed without difficulty. The                            patient tolerated the procedure well. The quality                            of the bowel preparation was excellent. The                            ileocecal valve, appendiceal orifice, and rectum                            were photographed. Scope In: 1:42:59 PM Scope Out: 1:55:49 PM Scope Withdrawal Time: 0 hours 9 minutes 36 seconds  Total Procedure Duration: 0 hours 12 minutes 50 seconds  Findings:                 The perianal and digital rectal examinations were                            normal.                           A diminutive polyp was found in the transverse  colon. The polyp was semi-sessile. The polyp was                            removed with a cold snare. Resection and retrieval                            were complete.                           Repeat examination of right colon under NBI                            performed.                           The exam was otherwise without abnormality on                            direct and retroflexion views. Complications:            No immediate complications. Estimated Blood Loss:     Estimated blood loss was minimal. Impression:               - One diminutive polyp in the transverse colon,                            removed with a cold snare. Resected and retrieved.                           - The examination was otherwise normal on direct                            and retroflexion views. Recommendation:           - Patient has a contact number available for                            emergencies. The signs and  symptoms of potential                            delayed complications were discussed with the                            patient. Return to normal activities tomorrow.                            Written discharge instructions were provided to the                            patient.                           - Resume previous diet.                           - Continue present medications.                           -  Await pathology results.                           - No repeat surveillance colonoscopy due to age,                            current guidelines and low risk findings today. Jefry Lesinski L. Myrtie Neither, MD 09/25/2023 1:59:47 PM This report has been signed electronically.

## 2023-09-26 ENCOUNTER — Telehealth: Payer: Self-pay

## 2023-09-26 NOTE — Telephone Encounter (Signed)
 No answer after follow up call. Voice message left.

## 2023-09-29 ENCOUNTER — Encounter (HOSPITAL_BASED_OUTPATIENT_CLINIC_OR_DEPARTMENT_OTHER): Payer: Self-pay

## 2023-09-30 ENCOUNTER — Encounter: Payer: Self-pay | Admitting: Cardiology

## 2023-09-30 ENCOUNTER — Ambulatory Visit: Payer: Medicare PPO | Attending: Cardiology | Admitting: Cardiology

## 2023-09-30 ENCOUNTER — Encounter: Payer: Self-pay | Admitting: Gastroenterology

## 2023-09-30 VITALS — BP 132/70 | HR 70 | Ht 60.0 in | Wt 205.0 lb

## 2023-09-30 DIAGNOSIS — E785 Hyperlipidemia, unspecified: Secondary | ICD-10-CM | POA: Diagnosis not present

## 2023-09-30 DIAGNOSIS — I10 Essential (primary) hypertension: Secondary | ICD-10-CM | POA: Diagnosis not present

## 2023-09-30 DIAGNOSIS — Z8249 Family history of ischemic heart disease and other diseases of the circulatory system: Secondary | ICD-10-CM

## 2023-09-30 DIAGNOSIS — E66811 Obesity, class 1: Secondary | ICD-10-CM | POA: Diagnosis not present

## 2023-09-30 LAB — SURGICAL PATHOLOGY

## 2023-09-30 NOTE — Patient Instructions (Addendum)
 Medication Instructions:  No changes     Weight loss medication   Ozempic ( injections)  is the same as Rybelsus ( pill)   both diabetes  Mounjaro (injections ) low does is used for diabetes  , Zepbound ( higher dose) is used for weight loss   Wegovy  ( Injections) for weight loss  *If you need a refill on your cardiac medications before your next appointment, please call your pharmacy*   Lab Work: Not needed    Testing/Procedures: Not needed   Follow-Up: At [facility], you and your health needs are our priority.  As part of our continuing mission to provide you with exceptional heart care, we have created designated Provider Care Teams.  These Care Teams include your primary Cardiologist (physician) and Advanced Practice Providers (APPs -  Physician Assistants and Nurse Practitioners) who all work together to provide you with the care you need, when you need it.     Your next appointment:   12 month(s)  The format for your next appointment:   In Person  Provider:   Bryan Lemma, MD    Other Instructions   s

## 2023-09-30 NOTE — Progress Notes (Signed)
 Cardiology Office Note:  .   Date:  10/05/2023  ID:  Deanna Haley, DOB [birth year], MRN [id number] PCP: Porfirio Oar, PA  [facility] Providers Cardiologist:  Bryan Lemma, MD     Chief Complaint  Patient presents with   Follow-up    Very much related no follow-up.  Doing well.   Hypertension    BP stable.    Patient Profile: .     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 female with a PMH notable for HTN, HLD and family history of CAD but no personal history of CAD/CHF, DM-2, CVA or CKD who presents here for almost 2-year follow-up at the request of Porfirio Oar, [place].  Previously evaluated with Myoview that was nonischemic and echocardiogram is relatively normal.  Interview done with the assistance of An Research officer, political party.    Deanna Haley was last seen in June 2023 for preop clearance for rotator cuff surgery    Subjective  Discussed the use of AI scribe software for clinical note transcription with the patient, who gave verbal consent to proceed.  History of Present Illness History of Present Illness Deanna Haley is a 78 year old obese female with history of HTN and HLD who presents for a cardiology follow-up.  Interview assisted with the licensed American Sign Language (ASL) interpreter.  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  She presents today overall doing well with no major cardiac symptoms.  She is smiling and happy.  About the only issues that she has put on weight and has had issues with getting her Wegovy/Ozempic. She underwent back surgery approximately one year ago, resulting in reduced physical activity and a weight gain of about 17 pounds. Her BMI is currently 40.4. She has not started using Wegovy for weight loss due to insurance issues.  No symptoms of palpitations, orthopnea, paroxysmal nocturnal dyspnea, peripheral edema, headaches, blurred vision, dizziness, chest discomfort, or exertional dyspnea. She uses  a CPAP machine for sleep apnea.No symptoms of sudden onset weakness, numbness, dysarthria, drooling, facial droop, or claudication.  Her blood pressure was 130/72 at a recent primary care visit, but it was noted to be 140 during this visit. She is on atorvastatin for hyperlipidemia, with the last LDL reading of 102 in October 2023. There is a family history of hyperlipidemia.      Objective   Current Medications Reviewed: Amlodipine 2.5 mg daily, losartan 100 mg daily; aspirin 81 mg; atorvastatin 10 mg daily; as needed Xanax 0.5 mg nightly anxiety, Atrovent nasal sprays TID along with Os-Cal, vitamin B12, D3 supplements.  Not currently on Wegovy/semaglutide because of insurance issues.  Studies Reviewed: [name]   EKG Interpretation Date/Time:  Tuesday September 30 2023 16:39:50 EDT Ventricular Rate:  70 PR Interval:  190 QRS Duration:  92 QT Interval:  394 QTC Calculation: 425 R Axis:   -4  Text Interpretation: Normal sinus rhythm Minimal voltage criteria for LVH, may be normal variant ( R in aVL ) No previous ECGs available Confirmed by Bryan Lemma ([id number]) on 09/30/2023 4:49:32 PM    LABS CHECKED BY PCP TODAY (09/30/2023): CBC W8.5, H/H11.5/35.4, PLT 298; A1c 5.8.  No lipids checked. (04/2023): TC 200, LDL 102  Risk Assessment/Calculations:        Physical Exam:   VS:  BP 132/70   Pulse 70   Ht 5' (1.524 m)   Wt 205 lb (93 kg)   SpO2 99%   BMI 40.04 kg/m    Wt Readings from Last  3 Encounters:  09/30/23 205 lb (93 kg)  09/25/23 201 lb (91.2 kg)  09/11/23 201 lb (91.2 kg)    GEN: Well nourished, well developed in no acute distress; Deaf - uses sign language & lip reading.   NECK: No JVD; No carotid bruits CARDIAC: Normal S1, S2; RRR, no murmurs, rubs, gallops RESPIRATORY:  Clear to auscultation without rales, wheezing or rhonchi ; nonlabored, good air movement. ABDOMEN: Soft, non-tender, non-distended EXTREMITIES:  No edema; No deformity      ASSESSMENT AND PLAN: .     Problem List Items Addressed This Visit       Cardiology Problems   Essential hypertension - Primary (Chronic)   Blood pressure was 130/72 at her primary care visit, but she reported a reading of 140 at the current visit, possibly due to anxiety. Blood pressure is generally well-controlled. Weight loss is expected to further improve blood pressure control. - Monitor blood pressure regularly - Reassess blood pressure control at follow-up      Relevant Orders   EKG 12-Lead (Completed)   Hyperlipidemia with target LDL less than 100 (Chronic)   LDL cholesterol levels are slightly elevated, with a recent LDL of 102 and total cholesterol of 200.  The target LDL is below 100, ideally closer to 70, due to her family history.  Weight gain of approximately 17 pounds since back surgery may contribute to elevated cholesterol levels. Weight loss is expected to improve cholesterol levels. - Consider increasing atorvastatin dosage if LDL remains above 100 - Monitor cholesterol levels closely        Other   Family history of premature coronary artery disease (Chronic)   No active cardiac symptoms.  Continue risk factor modification with blood pressure and lipid management.  Also weight loss.      Obesity (BMI 30.0-34.9) (Chronic)   BMI is 40.4, classifying her as morbidly obese. Weight gain is attributed to reduced activity following back surgery. Discussed benefits of weight loss, including improved cholesterol, blood pressure, A1c levels, and overall energy. She is interested in [place] for weight loss, but insurance has not approved it. Suggested Zepbound as an alternative if Reg[facility] is not approved. Emphasized that weight loss could reduce medication needs for diabetes, cholesterol, and blood pressure. - Await insurance approval for [facility] or consider Zepbound as an alternative - Encourage weight loss through increased physical activity as her back allows        Follow-Up: Return in about 1  year (around 09/29/2024) for Routine follow up with me, [employer]. Awaiting results of recent lab work, including cholesterol levels. Discussed importance of maintaining regular follow-up to monitor cardiovascular health. - Review lab results once available - Follow up in one year unless issues arise sooner    Signed, Marykay Lex, MD, MS Bryan Lemma, M.D., M.S. Interventional Cardiologist  [facility]  Pager # [phone] Phone # [phone] [address] [place], [place] [address]

## 2023-10-05 ENCOUNTER — Encounter: Payer: Self-pay | Admitting: Cardiology

## 2023-10-05 NOTE — Assessment & Plan Note (Signed)
 LDL cholesterol levels are slightly elevated, with a recent LDL of 102 and total cholesterol of 200.  The target LDL is below 100, ideally closer to 70, due to her family history.  Weight gain of approximately 17 pounds since back surgery may contribute to elevated cholesterol levels. Weight loss is expected to improve cholesterol levels. - Consider increasing atorvastatin dosage if LDL remains above 100 - Monitor cholesterol levels closely

## 2023-10-05 NOTE — Assessment & Plan Note (Signed)
 Blood pressure was 130/72 at her primary care visit, but she reported a reading of 140 at the current visit, possibly due to anxiety. Blood pressure is generally well-controlled. Weight loss is expected to further improve blood pressure control. - Monitor blood pressure regularly - Reassess blood pressure control at follow-up

## 2023-10-05 NOTE — Assessment & Plan Note (Signed)
 No active cardiac symptoms.  Continue risk factor modification with blood pressure and lipid management.  Also weight loss.

## 2023-10-05 NOTE — Assessment & Plan Note (Signed)
 BMI is 40.4, classifying her as morbidly obese. Weight gain is attributed to reduced activity following back surgery. Discussed benefits of weight loss, including improved cholesterol, blood pressure, A1c levels, and overall energy. She is interested in [place] for weight loss, but insurance has not approved it. Suggested Zepbound as an alternative if Reginal L[facility] is not approved. Emphasized that weight loss could reduce medication needs for diabetes, cholesterol, and blood pressure. - Await insurance approval for [facility] or consider Zepbound as an alternative - Encourage weight loss through increased physical activity as her back allows

## 2023-10-07 ENCOUNTER — Telehealth: Payer: Self-pay | Admitting: Gastroenterology

## 2023-10-07 NOTE — Telephone Encounter (Signed)
 Patient called with a sign language interpreter and stated that she received a letter in the mail regarding her having adenomatous. Patient stated that she is needing clarification on this letter. Patient is requesting a call back. Please advise.

## 2023-10-07 NOTE — Telephone Encounter (Signed)
 I was able to speak with the pt using interpreter service (dial her home number) all questions answered to the best of my ability per the letter below. The pt has no further questions at this time.  She agrees that no further colonoscopy are needed. She will call if she has any concerns however.     Dear Ms. Arif,  The polyp(s) removed from your colon was/were determined to be adenomatous.  This type of polyp had the potential to grow and change into colon cancer, but most polyps of this type will not do so. Based on your age and current nationally-recognized guidelines, a future surveillance colonoscopy is not recommended.   If you have any questions or concerns, please don't hesitate to call.  Sincerely,    Sherrilyn Rist, MD

## 2024-05-04 ENCOUNTER — Other Ambulatory Visit: Payer: Self-pay | Admitting: Physician Assistant

## 2024-05-04 DIAGNOSIS — Z1231 Encounter for screening mammogram for malignant neoplasm of breast: Secondary | ICD-10-CM

## 2024-06-01 ENCOUNTER — Ambulatory Visit
Admission: RE | Admit: 2024-06-01 | Discharge: 2024-06-01 | Disposition: A | Source: Ambulatory Visit | Attending: Physician Assistant | Admitting: Physician Assistant

## 2024-06-01 DIAGNOSIS — Z1231 Encounter for screening mammogram for malignant neoplasm of breast: Secondary | ICD-10-CM

## 2024-08-06 ENCOUNTER — Encounter: Payer: Self-pay | Admitting: *Deleted

## 2024-08-06 LAB — POCT ABI - SCREENING FOR PILOT NO CHARGE
Left ABI: 1.09
Right ABI: 1.11

## 2024-08-06 LAB — GLUCOSE, POCT (MANUAL RESULT ENTRY): Glucose Fasting, POC: 88 mg/dL (ref 70–99)

## 2024-08-06 NOTE — Progress Notes (Unsigned)
 The pt attended the [facility] event on 08/06/2024 where she was screened for ABI, SDOH, smoking, blood glucose, and BP. Her ABI results were 1.11 right, 1.09 left. Her fasting glucose was 88 mg/dl. Her bp was 160/93. She did not indicate smoking status, she has a PCP (Dr. Bernita Diver), she has insurance, she did not indicate smoking status, pt has possible food SDOH concern, her form was not fully completed.
